# Patient Record
Sex: Male | Born: 1960 | Race: Black or African American | Hispanic: No | Marital: Married | Smoking: Never smoker
Health system: Southern US, Community
[De-identification: ages and names within clinical notes are randomized; demographics above are authoritative.]

## PROBLEM LIST (undated history)

## (undated) DIAGNOSIS — Z86718 Personal history of other venous thrombosis and embolism: Secondary | ICD-10-CM

## (undated) DIAGNOSIS — M1990 Unspecified osteoarthritis, unspecified site: Secondary | ICD-10-CM

## (undated) DIAGNOSIS — I10 Essential (primary) hypertension: Secondary | ICD-10-CM

## (undated) HISTORY — PX: KNEE SURGERY: SHX244

---

## 2013-12-11 DIAGNOSIS — M25561 Pain in right knee: Secondary | ICD-10-CM | POA: Insufficient documentation

## 2013-12-11 DIAGNOSIS — M25552 Pain in left hip: Secondary | ICD-10-CM | POA: Insufficient documentation

## 2013-12-11 DIAGNOSIS — M25562 Pain in left knee: Secondary | ICD-10-CM

## 2013-12-11 DIAGNOSIS — M25551 Pain in right hip: Secondary | ICD-10-CM | POA: Insufficient documentation

## 2013-12-11 DIAGNOSIS — I10 Essential (primary) hypertension: Secondary | ICD-10-CM | POA: Insufficient documentation

## 2015-08-14 DIAGNOSIS — L821 Other seborrheic keratosis: Secondary | ICD-10-CM | POA: Insufficient documentation

## 2015-11-06 DIAGNOSIS — N3001 Acute cystitis with hematuria: Secondary | ICD-10-CM | POA: Insufficient documentation

## 2016-02-17 DIAGNOSIS — M1710 Unilateral primary osteoarthritis, unspecified knee: Secondary | ICD-10-CM | POA: Insufficient documentation

## 2017-11-03 DIAGNOSIS — H0014 Chalazion left upper eyelid: Secondary | ICD-10-CM | POA: Insufficient documentation

## 2018-02-24 DIAGNOSIS — M179 Osteoarthritis of knee, unspecified: Secondary | ICD-10-CM | POA: Insufficient documentation

## 2018-02-24 DIAGNOSIS — M1710 Unilateral primary osteoarthritis, unspecified knee: Secondary | ICD-10-CM | POA: Insufficient documentation

## 2018-02-26 ENCOUNTER — Other Ambulatory Visit: Payer: Self-pay

## 2018-02-26 ENCOUNTER — Emergency Department: Payer: 59

## 2018-02-26 ENCOUNTER — Encounter: Payer: Self-pay | Admitting: Emergency Medicine

## 2018-02-26 ENCOUNTER — Inpatient Hospital Stay
Admission: EM | Admit: 2018-02-26 | Discharge: 2018-03-02 | DRG: 270 | Disposition: A | Discharge status: Home / Self Care | Payer: 59 | Attending: Internal Medicine | Admitting: Internal Medicine

## 2018-02-26 DIAGNOSIS — I82401 Acute embolism and thrombosis of unspecified deep veins of right lower extremity: Secondary | ICD-10-CM | POA: Diagnosis not present

## 2018-02-26 DIAGNOSIS — I82402 Acute embolism and thrombosis of unspecified deep veins of left lower extremity: Secondary | ICD-10-CM | POA: Diagnosis not present

## 2018-02-26 DIAGNOSIS — I82492 Acute embolism and thrombosis of other specified deep vein of left lower extremity: Secondary | ICD-10-CM

## 2018-02-26 DIAGNOSIS — I10 Essential (primary) hypertension: Secondary | ICD-10-CM | POA: Diagnosis present

## 2018-02-26 DIAGNOSIS — Z6841 Body Mass Index (BMI) 40.0 and over, adult: Secondary | ICD-10-CM | POA: Diagnosis not present

## 2018-02-26 DIAGNOSIS — Z91041 Radiographic dye allergy status: Secondary | ICD-10-CM | POA: Diagnosis not present

## 2018-02-26 DIAGNOSIS — M1990 Unspecified osteoarthritis, unspecified site: Secondary | ICD-10-CM | POA: Diagnosis present

## 2018-02-26 DIAGNOSIS — I824Y2 Acute embolism and thrombosis of unspecified deep veins of left proximal lower extremity: Secondary | ICD-10-CM | POA: Diagnosis not present

## 2018-02-26 DIAGNOSIS — I82432 Acute embolism and thrombosis of left popliteal vein: Secondary | ICD-10-CM | POA: Diagnosis present

## 2018-02-26 DIAGNOSIS — R0902 Hypoxemia: Secondary | ICD-10-CM | POA: Diagnosis present

## 2018-02-26 DIAGNOSIS — I5030 Unspecified diastolic (congestive) heart failure: Secondary | ICD-10-CM | POA: Diagnosis not present

## 2018-02-26 DIAGNOSIS — E119 Type 2 diabetes mellitus without complications: Secondary | ICD-10-CM | POA: Diagnosis present

## 2018-02-26 DIAGNOSIS — I82462 Acute embolism and thrombosis of left calf muscular vein: Secondary | ICD-10-CM | POA: Diagnosis present

## 2018-02-26 DIAGNOSIS — R0602 Shortness of breath: Secondary | ICD-10-CM | POA: Diagnosis not present

## 2018-02-26 DIAGNOSIS — I82412 Acute embolism and thrombosis of left femoral vein: Secondary | ICD-10-CM | POA: Diagnosis present

## 2018-02-26 DIAGNOSIS — I2699 Other pulmonary embolism without acute cor pulmonale: Secondary | ICD-10-CM | POA: Diagnosis present

## 2018-02-26 DIAGNOSIS — Z79899 Other long term (current) drug therapy: Secondary | ICD-10-CM

## 2018-02-26 DIAGNOSIS — I82409 Acute embolism and thrombosis of unspecified deep veins of unspecified lower extremity: Secondary | ICD-10-CM

## 2018-02-26 HISTORY — DX: Essential (primary) hypertension: I10

## 2018-02-26 HISTORY — DX: Unspecified osteoarthritis, unspecified site: M19.90

## 2018-02-26 LAB — LIPID PANEL
Cholesterol: 90 mg/dL (ref 0–200)
HDL: 40 mg/dL — ABNORMAL LOW (ref >40)
LDL Cholesterol: 10 mg/dL (ref 0–99)
Total CHOL/HDL Ratio: 2.3 RATIO
Triglycerides: 202 mg/dL — ABNORMAL HIGH (ref <150)
VLDL: 40 mg/dL (ref 0–40)

## 2018-02-26 LAB — BASIC METABOLIC PANEL
Anion gap: 12 (ref 5–15)
BUN: 18 mg/dL (ref 6–20)
CO2: 22 mmol/L (ref 22–32)
Calcium: 8.7 mg/dL — ABNORMAL LOW (ref 8.9–10.3)
Chloride: 105 mmol/L (ref 98–111)
Creatinine, Ser: 1.21 mg/dL (ref 0.61–1.24)
GFR calc Af Amer: >60 mL/min (ref >60)
GFR calc non Af Amer: >60 mL/min (ref >60)
Glucose, Bld: 181 mg/dL — ABNORMAL HIGH (ref 70–99)
Potassium: 3.7 mmol/L (ref 3.5–5.1)
Sodium: 139 mmol/L (ref 135–145)

## 2018-02-26 LAB — CBC
HCT: 46.6 % (ref 39.0–52.0)
Hemoglobin: 15.9 g/dL (ref 13.0–17.0)
MCH: 29.3 pg (ref 26.0–34.0)
MCHC: 34.1 g/dL (ref 30.0–36.0)
MCV: 85.8 fL (ref 80.0–100.0)
Platelets: 216 10*3/uL (ref 150–400)
RBC: 5.43 MIL/uL (ref 4.22–5.81)
RDW: 13.8 % (ref 11.5–15.5)
WBC: 14.7 10*3/uL — ABNORMAL HIGH (ref 4.0–10.5)
nRBC: 0 % (ref 0.0–0.2)

## 2018-02-26 LAB — HEPARIN LEVEL (UNFRACTIONATED): Heparin Unfractionated: 0.39 IU/mL (ref 0.30–0.70)

## 2018-02-26 LAB — PROTIME-INR
INR: 1.14
Prothrombin Time: 14.5 seconds (ref 11.4–15.2)

## 2018-02-26 LAB — TROPONIN I: Troponin I: 0.05 ng/mL (ref <0.03)

## 2018-02-26 LAB — APTT: aPTT: <24 seconds — ABNORMAL LOW (ref 24–36)

## 2018-02-26 MED ORDER — HEPARIN BOLUS VIA INFUSION
7500.0000 [IU] | Freq: Once | INTRAVENOUS | Status: AC
  Administered 2018-02-26: 7500 [IU] via INTRAVENOUS
  Filled 2018-02-26: qty 7500

## 2018-02-26 MED ORDER — HEPARIN (PORCINE) IN NACL 100-0.45 UNIT/ML-% IJ SOLN
2500.0000 [IU]/h | INTRAMUSCULAR | Status: AC
  Administered 2018-02-26: 2600 [IU]/h via INTRAVENOUS
  Administered 2018-02-26: 2300 [IU]/h via INTRAVENOUS
  Administered 2018-02-27 (×2): 2500 [IU]/h via INTRAVENOUS
  Filled 2018-02-26 (×5): qty 250

## 2018-02-26 MED ORDER — TRIAMTERENE-HCTZ 37.5-25 MG PO CAPS
1.0000 | ORAL_CAPSULE | Freq: Every day | ORAL | Status: DC
  Administered 2018-02-27 – 2018-03-02 (×4): 1 via ORAL
  Filled 2018-02-26 (×4): qty 1

## 2018-02-26 MED ORDER — METOPROLOL SUCCINATE ER 100 MG PO TB24
100.0000 mg | ORAL_TABLET | Freq: Every day | ORAL | Status: DC
  Administered 2018-02-27 – 2018-03-02 (×4): 100 mg via ORAL
  Filled 2018-02-26 (×4): qty 1

## 2018-02-26 MED ORDER — DOCUSATE SODIUM 100 MG PO CAPS: 100.0000 mg | ORAL_CAPSULE | Freq: Two times a day (BID) | ORAL | Status: DC | PRN

## 2018-02-26 NOTE — Consult Note (Signed)
ANTICOAGULATION CONSULT NOTE - Initial Consult  Pharmacy Consult for heparin drip Indication: DVT  Allergies  Allergen Reactions  . Iodinated Diagnostic Agents Anaphylaxis    Patient Measurements: Height: 6\' 7"  (200.7 cm) Weight: (!) 413 lb 6.4 oz (187.5 kg) IBW/kg (Calculated) : 93.7 Heparin Dosing Weight: 139.1kg  Vital Signs: Temp: 98.7 F (37.1 C) (10/13 1658) Temp Source: Oral (10/13 1658) BP: 158/88 (10/13 1658) Pulse Rate: 78 (10/13 1658)  Labs: Recent Labs    02/26/18 0854 02/26/18 1140 02/26/18 1730  HGB 15.9  --   --   HCT 46.6  --   --   PLT 216  --   --   APTT  --  <24*  --   LABPROT  --  14.5  --   INR  --  1.14  --   HEPARINUNFRC  --   --  0.39  CREATININE 1.21  --   --   TROPONINI 0.05*  --   --     Estimated Creatinine Clearance: 126.5 mL/min (by C-G formula based on SCr of 1.21 mg/dL).   Medical History: Past Medical History:  Diagnosis Date  . Arthritis   . Hypertension     Medications:  Scheduled:  . [START ON 02/27/2018] metoprolol succinate  100 mg Oral Daily  . [START ON 02/27/2018] triamterene-hydrochlorothiazide  1 capsule Oral Daily    Assessment: Patient is a 57 year old male who recently drove from [place] to [place]. Found to have a DVT and possible PE as he presents with leg pain and SOB. Pharmacy is consulted to dose heparin drip.  Initiation: 7500 units bolus, then infusion at 2300 units/hr 10/12 1730 HL 0.33  Goal of Therapy:  Heparin level 0.3-0.7 units/ml Monitor platelets by anticoagulation protocol: Yes   Plan: heparin level is borderline low. Will increase infusion to 2600 units/hr Check anti-Xa level in 6 hours and daily while on heparin Continue to monitor H&H and platelets  Burnis Medin, PharmD Clinical Pharmacist 02/26/2018,5:58 PM

## 2018-02-26 NOTE — H&P (Signed)
[facility] - [place] at [facility]   PATIENT NAME: Hunter Hobbs    MR#:  [id number]  DATE OF BIRTH:  [birth year]  DATE OF ADMISSION:  02/26/2018  PRIMARY CARE PHYSICIAN: System, Pcp Not In   REQUESTING/REFERRING PHYSICIAN: Dr.Paduchowski  CHIEF COMPLAINT:   Chief Complaint  Patient presents with  . Shortness of Breath    HISTORY OF PRESENT ILLNESS: Hunter Hobbs  is a 57 y.o. male with a known history of arthritis and hypertension-duo from [place] (16 hours) to [facility] on September 30, without taking any breaks except for feeling of his gas tank.  For last 1 day he has complained of some chest pain and shortness of breath with minimal exertion so came to emergency room. He has severe allergic reaction to iodine dye so ER physician could not do a CT scan on chest.  They did Doppler study on the lower extremity which confirms DVT. Started on heparin IV drip and given to hospitalist team for further management. Patient denies any history of blood clots in the past or in any family members.  PAST MEDICAL HISTORY:   Past Medical History:  Diagnosis Date  . Arthritis   . Hypertension     PAST SURGICAL HISTORY:  Past Surgical History:  Procedure Laterality Date  . KNEE SURGERY      SOCIAL HISTORY:  Social History   Tobacco Use  . Smoking status: Never Smoker  . Smokeless tobacco: Never Used  Substance Use Topics  . Alcohol use: Not Currently    Frequency: Never    FAMILY HISTORY: History reviewed. No pertinent family history.  DRUG ALLERGIES:  Allergies  Allergen Reactions  . Iodinated Diagnostic Agents Anaphylaxis    REVIEW OF SYSTEMS:   CONSTITUTIONAL: No fever, fatigue or weakness.  EYES: No blurred or double vision.  EARS, NOSE, AND THROAT: No tinnitus or ear pain.  RESPIRATORY: No cough, shortness of breath, wheezing or hemoptysis.  CARDIOVASCULAR: Patient have chest pain, no orthopnea, edema.  GASTROINTESTINAL: No nausea, vomiting,  diarrhea or abdominal pain.  GENITOURINARY: No dysuria, hematuria.  ENDOCRINE: No polyuria, nocturia,  HEMATOLOGY: No anemia, easy bruising or bleeding SKIN: No rash or lesion. MUSCULOSKELETAL: No joint pain or arthritis.   NEUROLOGIC: No tingling, numbness, weakness.  PSYCHIATRY: No anxiety or depression.   MEDICATIONS AT HOME:  Prior to Admission medications   Medication Sig Start Date End Date Taking? Authorizing Provider  metoprolol succinate (TOPROL-XL) 100 MG 24 hr tablet Take 100 mg by mouth daily. Take with or immediately following a meal.   Yes [provider]  triamterene-hydrochlorothiazide (DYAZIDE) 37.5-25 MG capsule Take 1 capsule by mouth daily. 06/07/16  Yes [provider]      PHYSICAL EXAMINATION:   VITAL SIGNS: Blood pressure (!) 135/109, pulse 71, temperature 97.9 F (36.6 C), temperature source Oral, resp. rate 20, height 6\' 7"  (2.007 m), weight (!) 190.5 kg, SpO2 (!) 88 %.  GENERAL:  57 y.o.-year-old patient lying in the bed with no acute distress.  EYES: Pupils equal, round, reactive to light and accommodation. No scleral icterus. Extraocular muscles intact.  HEENT: Head atraumatic, normocephalic. Oropharynx and nasopharynx clear.  NECK:  Supple, no jugular venous distention. No thyroid enlargement, no tenderness.  LUNGS: Normal breath sounds bilaterally, no wheezing, rales,rhonchi or crepitation. No use of accessory muscles of respiration.  CARDIOVASCULAR: S1, S2 normal. No murmurs, rubs, or gallops.  ABDOMEN: Soft, nontender, nondistended. Bowel sounds present. No organomegaly or mass.  EXTREMITIES: No pedal edema, cyanosis, or  clubbing.  NEUROLOGIC: Cranial nerves II through XII are intact. Muscle strength 5/5 in all extremities. Sensation intact. Gait not checked.  PSYCHIATRIC: The patient is alert and oriented x 3.  SKIN: No obvious rash, lesion, or ulcer.   LABORATORY PANEL:   CBC Recent Labs  Lab 02/26/18 0854  WBC 14.7*  HGB  15.9  HCT 46.6  PLT 216  MCV 85.8  MCH 29.3  MCHC 34.1  RDW 13.8   ------------------------------------------------------------------------------------------------------------------  Chemistries  Recent Labs  Lab 02/26/18 0854  NA 139  K 3.7  CL 105  CO2 22  GLUCOSE 181*  BUN 18  CREATININE 1.21  CALCIUM 8.7*   ------------------------------------------------------------------------------------------------------------------ estimated creatinine clearance is 127.7 mL/min (by C-G formula based on SCr of 1.21 mg/dL). ------------------------------------------------------------------------------------------------------------------ No results for input(s): TSH, T4TOTAL, T3FREE, THYROIDAB in the last 72 hours.  Invalid input(s): FREET3   Coagulation profile Recent Labs  Lab 02/26/18 1140  INR 1.14   ------------------------------------------------------------------------------------------------------------------- No results for input(s): DDIMER in the last 72 hours. -------------------------------------------------------------------------------------------------------------------  Cardiac Enzymes Recent Labs  Lab 02/26/18 0854  TROPONINI 0.05*   ------------------------------------------------------------------------------------------------------------------ Invalid input(s): POCBNP  ---------------------------------------------------------------------------------------------------------------  Urinalysis No results found for: COLORURINE, APPEARANCEUR, LABSPEC, PHURINE, GLUCOSEU, HGBUR, BILIRUBINUR, KETONESUR, PROTEINUR, UROBILINOGEN, NITRITE, LEUKOCYTESUR   RADIOLOGY: Dg Chest 2 View  Result Date: 02/26/2018 CLINICAL DATA:  Shortness of breath and lower leg pain EXAM: CHEST - 2 VIEW COMPARISON:  None. FINDINGS: Cardiac shadows within normal limits. The lungs are well aerated bilaterally. No focal infiltrate or sizable effusion is seen. Degenerative changes of  the thoracic spine are noted. IMPRESSION: No active cardiopulmonary disease. Electronically Signed   By: Alcide Clever M.D.   On: 02/26/2018 09:53   US Venous Img Lower Unilateral Left  Result Date: 02/26/2018 CLINICAL DATA:  Shortness of breath and thigh pain EXAM: LEFT LOWER EXTREMITY VENOUS DOPPLER ULTRASOUND TECHNIQUE: Gray-scale sonography with graded compression, as well as color Doppler and duplex ultrasound were performed to evaluate the lower extremity deep venous systems from the level of the common femoral vein and including the common femoral, femoral, profunda femoral, popliteal and calf veins including the posterior tibial, peroneal and gastrocnemius veins when visible. The superficial great saphenous vein was also interrogated. Spectral Doppler was utilized to evaluate flow at rest and with distal augmentation maneuvers in the common femoral, femoral and popliteal veins. COMPARISON:  None. FINDINGS: Contralateral Common Femoral Vein: Respiratory phasicity is normal and symmetric with the symptomatic side. No evidence of thrombus. Normal compressibility. Common Femoral Vein: No evidence of thrombus. Normal compressibility, respiratory phasicity and response to augmentation. Saphenofemoral Junction: No evidence of thrombus. Normal compressibility and flow on color Doppler imaging. Profunda Femoral Vein: No evidence of thrombus. Normal compressibility and flow on color Doppler imaging. Femoral Vein: Occlusive thrombus is noted with noncompressibility. Popliteal Vein: Occlusive thrombus with noncompressibility. Calf Veins: Occlusive thrombus with noncompressibility. Superficial Great Saphenous Vein: No evidence of thrombus. Normal compressibility. Venous Reflux:  None. Other Findings:  None. IMPRESSION: Left deep venous thrombosis extending from the calf veins into the femoral vein. The common femoral vein is not involved. Electronically Signed   By: Alcide Clever M.D.   On: 02/26/2018 09:51     EKG: Orders placed or performed during the hospital encounter of 02/26/18  . EKG 12-Lead  . EKG 12-Lead  . ED EKG within 10 minutes  . ED EKG within 10 minutes    IMPRESSION AND PLAN:  *DVT Possible pulmonary embolism  IV heparin drip for now. Will be  able to switch to oral tablet once patient is more comfortable. Check echocardiogram. VQ scan is ordered as patient has allergy to IV contrast.  *Hypertension Continue home medications.  *Leukocytosis Could be secondary to stress of DVT.  All the records are reviewed and case discussed with ED provider. Management plans discussed with the patient, family and they are in agreement.  CODE STATUS: Full code.   TOTAL TIME TAKING CARE OF THIS PATIENT: 45 minutes.    Altamese Dilling M.D on 02/26/2018   Between 7am to 6pm - Pager - [phone]  After 6pm go to [email or web address] - password [email or web address]  [facility]  Office  [phone]  CC: Primary care physician; System, Pcp Not In   Note: This dictation was prepared with Dragon dictation along with smaller phrase technology. Any transcriptional errors that result from this process are unintentional.

## 2018-02-26 NOTE — ED Provider Notes (Signed)
[facility] Emergency Department Provider Note  Time seen: 9:52 AM  I have reviewed the triage vital signs and the nursing notes.   HISTORY  Chief Complaint Shortness of Breath    HPI Hunter Hobbs is a 57 y.o. male with a past medical history of arthritis, hypertension, presents to the emergency department for left thigh pain and shortness of breath.  According to the patient last week he drove 16 hours from [place] to [place].  States on Thursday (3 days ago) he developed shortness of breath with exertion and left thigh pain.  States his shortness of breath has continued, minimal shortness of breath at rest but moderate shortness of breath with any type of exertion.  Denies any history of DVT/PE previously.  Denies any chest pain.  Denies any cough or congestion.  Denies any fever.   Past Medical History:  Diagnosis Date  . Arthritis   . Hypertension     There are no active problems to display for this patient.   Past Surgical History:  Procedure Laterality Date  . KNEE SURGERY      Prior to Admission medications   Not on File    Allergies  Allergen Reactions  . Iodinated Diagnostic Agents Anaphylaxis    History reviewed. No pertinent family history.  Social History Social History   Tobacco Use  . Smoking status: Never Smoker  . Smokeless tobacco: Never Used  Substance Use Topics  . Alcohol use: Not Currently    Frequency: Never  . Drug use: Never    Review of Systems Constitutional: Negative for fever. Cardiovascular: Negative for chest pain. Respiratory: Positive for shortness of breath, worse with exertion Gastrointestinal: Negative for abdominal pain, vomiting Musculoskeletal left thigh pain Skin: Negative for skin complaints  Neurological: Negative for headache All other ROS negative  ____________________________________________   PHYSICAL EXAM:  VITAL SIGNS: ED Triage Vitals  Enc Vitals Group     BP 02/26/18  0844 (!) 136/93     Pulse Rate 02/26/18 0844 87     Resp 02/26/18 0844 20     Temp 02/26/18 0844 97.9 F (36.6 C)     Temp Source 02/26/18 0844 Oral     SpO2 02/26/18 0844 96 %     Weight 02/26/18 0845 (!) 420 lb (190.5 kg)     Height 02/26/18 0845 6\' 7"  (2.007 m)     Head Circumference --      Peak Flow --      Pain Score 02/26/18 0849 8     Pain Loc --      Pain Edu? --      Excl. in GC? --     Constitutional: Alert and oriented. Well appearing and in no distress. Eyes: Normal exam ENT   Head: Normocephalic and atraumatic.   Mouth/Throat: Mucous membranes are moist. Cardiovascular: Normal rate, regular rhythm. No murmur Respiratory: Normal respiratory effort without tachypnea nor retractions. Breath sounds are clear Gastrointestinal: Soft and nontender. No distention.  Musculoskeletal: No lower extremity tenderness or edema.  No thigh tenderness. Neurologic:  Normal speech and language. No gross focal neurologic deficits  Skin:  Skin is warm, dry and intact.  Psychiatric: Mood and affect are normal. Speech and behavior are normal.   ____________________________________________    EKG  EKG reviewed and interpreted by myself shows a normal sinus rhythm at 91 bpm with a narrow QRS, normal axis, normal intervals, no concerning ST changes.  Occasional PVC.  Overall pattern is consistent with  S1Q3T3  ____________________________________________    RADIOLOGY  Ultrasound positive for left lower exam DVT Chest x-ray negative  ____________________________________________   INITIAL IMPRESSION / ASSESSMENT AND PLAN / ED COURSE  Pertinent labs & imaging results that were available during my care of the patient were reviewed by me and considered in my medical decision making (see chart for details).  Patient presents to the emergency department for shortness of breath and left thigh pain.  Recent long drive from [place] to [place].  Concern would be for DVT with  possible pulmonary embolism.  Patient's labs are resulted largely within normal limits besides a mild leukocytosis of 14,000 and a troponin of 0.05.  We will obtain ultrasound of the left lower extremity as well as a chest x-ray.  Unfortunately patient has an anaphylactic reaction to IV contrast, I have ordered a perfusion scan.  If the ultrasound results positive for DVT we will start the patient on heparin infusion and admit to the hospitalist service for presumed pulmonary embolism.  If negative we will obtain the perfusion scan from the emergency department.  [place] positive for DVT we will start the patient on heparin infusion and obtain a VQ scan.  Patient will be admitted to the hospitalist service.  CRITICAL CARE Performed by: Minna Antis   Total critical care time: 30 minutes  Critical care time was exclusive of separately billable procedures and treating other patients.  Critical care was necessary to treat or prevent imminent or life-threatening deterioration.  Critical care was time spent personally by me on the following activities: development of treatment plan with patient and/or surrogate as well as nursing, discussions with consultants, evaluation of patient's response to treatment, examination of patient, obtaining history from patient or surrogate, ordering and performing treatments and interventions, ordering and review of laboratory studies, ordering and review of radiographic studies, pulse oximetry and re-evaluation of patient's condition.   ____________________________________________   FINAL CLINICAL IMPRESSION(S) / ED DIAGNOSES  DVT, left lower extremity Pulmonary embolism    Minna Antis, MD 02/26/18 1024

## 2018-02-26 NOTE — ED Notes (Signed)
Report to Jancy, RN  

## 2018-02-26 NOTE — Consult Note (Signed)
ANTICOAGULATION CONSULT NOTE - Initial Consult  Pharmacy Consult for heparin drip Indication: DVT  Allergies  Allergen Reactions  . Iodinated Diagnostic Agents Anaphylaxis    Patient Measurements: Height: 6\' 7"  (200.7 cm) Weight: (!) 420 lb (190.5 kg) IBW/kg (Calculated) : 93.7 Heparin Dosing Weight: 139.1kg  Vital Signs: Temp: 97.9 F (36.6 C) (10/13 0844) Temp Source: Oral (10/13 0844) BP: 136/93 (10/13 0844) Pulse Rate: 87 (10/13 0844)  Labs: Recent Labs    02/26/18 0854  HGB 15.9  HCT 46.6  PLT 216  CREATININE 1.21  TROPONINI 0.05*    Estimated Creatinine Clearance: 127.7 mL/min (by C-G formula based on SCr of 1.21 mg/dL).   Medical History: Past Medical History:  Diagnosis Date  . Arthritis   . Hypertension     Medications:  Scheduled:  . heparin  7,500 Units Intravenous Once    Assessment: Patient is a 57 year old male who recently drove from [place] to [place]. Found to have a DVT and possible PE as he presents with leg pain and SOB. Pharmacy is consulted to dose heparin drip  Goal of Therapy:  Heparin level 0.3-0.7 units/ml Monitor platelets by anticoagulation protocol: Yes   Plan:  Give 7500 units bolus x 1 Start heparin infusion at 2300 units/hr Check anti-Xa level in 6 hours and daily while on heparin Continue to monitor H&H and platelets  Hunter Hobbs D Dannia Snook, Pharm.D, BCPS Clinical Pharmacist 02/26/2018,10:42 AM

## 2018-02-26 NOTE — ED Triage Notes (Signed)
Started with acute [facility] Thursday. Flew in from [place] week and half ago.  SHOB worse on exertion. Unlabored sitting in triage.  Friday began with inner left thigh pain.  No CP currently.

## 2018-02-27 ENCOUNTER — Inpatient Hospital Stay: Payer: 59

## 2018-02-27 ENCOUNTER — Inpatient Hospital Stay (HOSPITAL_COMMUNITY)
Admit: 2018-02-27 | Discharge: 2018-02-27 | Disposition: A | Discharge status: Home / Self Care | Payer: 59 | Attending: Internal Medicine | Admitting: Internal Medicine

## 2018-02-27 ENCOUNTER — Encounter: Payer: Self-pay | Admitting: Radiology

## 2018-02-27 DIAGNOSIS — I5030 Unspecified diastolic (congestive) heart failure: Secondary | ICD-10-CM

## 2018-02-27 DIAGNOSIS — I2699 Other pulmonary embolism without acute cor pulmonale: Secondary | ICD-10-CM

## 2018-02-27 LAB — HEPARIN LEVEL (UNFRACTIONATED)
Heparin Unfractionated: 0.69 IU/mL (ref 0.30–0.70)
Heparin Unfractionated: 0.62 IU/mL (ref 0.30–0.70)
Heparin Unfractionated: 0.49 IU/mL (ref 0.30–0.70)

## 2018-02-27 LAB — HEMOGLOBIN A1C
Hgb A1c MFr Bld: 7.1 % — ABNORMAL HIGH (ref 4.8–5.6)
Mean Plasma Glucose: 157.07 mg/dL

## 2018-02-27 LAB — CBC
HCT: 44.9 % (ref 39.0–52.0)
Hemoglobin: 15.3 g/dL (ref 13.0–17.0)
MCH: 29.2 pg (ref 26.0–34.0)
MCHC: 34.1 g/dL (ref 30.0–36.0)
MCV: 85.7 fL (ref 80.0–100.0)
Platelets: 206 10*3/uL (ref 150–400)
RBC: 5.24 MIL/uL (ref 4.22–5.81)
RDW: 13.6 % (ref 11.5–15.5)
WBC: 15.3 10*3/uL — ABNORMAL HIGH (ref 4.0–10.5)
nRBC: 0 % (ref 0.0–0.2)

## 2018-02-27 LAB — BASIC METABOLIC PANEL
Anion gap: 10 (ref 5–15)
BUN: 18 mg/dL (ref 6–20)
CO2: 23 mmol/L (ref 22–32)
Calcium: 8.6 mg/dL — ABNORMAL LOW (ref 8.9–10.3)
Chloride: 104 mmol/L (ref 98–111)
Creatinine, Ser: 1.19 mg/dL (ref 0.61–1.24)
GFR calc Af Amer: >60 mL/min (ref >60)
GFR calc non Af Amer: >60 mL/min (ref >60)
Glucose, Bld: 158 mg/dL — ABNORMAL HIGH (ref 70–99)
Potassium: 3.7 mmol/L (ref 3.5–5.1)
Sodium: 137 mmol/L (ref 135–145)

## 2018-02-27 LAB — ECHOCARDIOGRAM COMPLETE
Height: 79 in
Weight: 6614.4 oz

## 2018-02-27 MED ORDER — TECHNETIUM TO 99M ALBUMIN AGGREGATED
4.9220 | Freq: Once | INTRAVENOUS | Status: AC | PRN
  Administered 2018-02-27: 4.922 via INTRAVENOUS

## 2018-02-27 MED ORDER — APIXABAN 5 MG PO TABS: 5.0000 mg | ORAL_TABLET | Freq: Two times a day (BID) | ORAL | Status: DC

## 2018-02-27 MED ORDER — OXYCODONE-ACETAMINOPHEN 5-325 MG PO TABS
1.0000 | ORAL_TABLET | Freq: Four times a day (QID) | ORAL | Status: DC | PRN
  Administered 2018-02-27 – 2018-03-01 (×6): 1 via ORAL
  Filled 2018-02-27 (×6): qty 1

## 2018-02-27 MED ORDER — TECHNETIUM TC 99M DIETHYLENETRIAME-PENTAACETIC ACID
41.7370 | Freq: Once | INTRAVENOUS | Status: AC | PRN
  Administered 2018-02-27: 41.737 via RESPIRATORY_TRACT

## 2018-02-27 MED ORDER — APIXABAN 5 MG PO TABS
10.0000 mg | ORAL_TABLET | Freq: Two times a day (BID) | ORAL | Status: DC
  Administered 2018-02-27 – 2018-02-28 (×3): 10 mg via ORAL
  Filled 2018-02-27 (×4): qty 2

## 2018-02-27 MED ORDER — PERFLUTREN LIPID MICROSPHERE
1.0000 mL | INTRAVENOUS | Status: AC | PRN
  Administered 2018-02-27: 2 mL via INTRAVENOUS
  Filled 2018-02-27: qty 10

## 2018-02-27 NOTE — Consult Note (Signed)
ANTICOAGULATION CONSULT NOTE - Initial Consult  Pharmacy Consult for heparin drip Indication: DVT  Allergies  Allergen Reactions  . Iodinated Diagnostic Agents Anaphylaxis    Patient Measurements: Height: 6\' 7"  (200.7 cm) Weight: (!) 413 lb 6.4 oz (187.5 kg) IBW/kg (Calculated) : 93.7 Heparin Dosing Weight: 139.1kg  Vital Signs: Temp: 98.3 F (36.8 C) (10/14 0842) Temp Source: Oral (10/14 0842) BP: 124/88 (10/14 0842) Pulse Rate: 79 (10/14 0842)  Labs: Recent Labs    02/26/18 0854 02/26/18 1140  02/27/18 0151 02/27/18 0928 02/27/18 1458  HGB 15.9  --   --  15.3  --   --   HCT 46.6  --   --  44.9  --   --   PLT 216  --   --  206  --   --   APTT  --  <24*  --   --   --   --   LABPROT  --  14.5  --   --   --   --   INR  --  1.14  --   --   --   --   HEPARINUNFRC  --   --    < > 0.69 0.62 0.49  CREATININE 1.21  --   --  1.19  --   --   TROPONINI 0.05*  --   --   --   --   --    < > = values in this interval not displayed.    Estimated Creatinine Clearance: 128.6 mL/min (by C-G formula based on SCr of 1.19 mg/dL).   Medical History: Past Medical History:  Diagnosis Date  . Arthritis   . Hypertension     Medications:  Scheduled:  . apixaban  10 mg Oral BID   Followed by  . [START ON 03/06/2018] apixaban  5 mg Oral BID  . metoprolol succinate  100 mg Oral Daily  . triamterene-hydrochlorothiazide  1 capsule Oral Daily    Assessment: Patient is a 57 year old male who recently drove from [place] to [facility]. Found to have a DVT and possible PE as he presents with leg pain and SOB. Pharmacy is consulted to dose heparin drip.  Currently on Heparin 2500 unit/hr, 1500 HL resulted at 0.49   Goal of Therapy:  Heparin level 0.3-0.7 units/ml Monitor platelets by anticoagulation protocol: Yes   Plan:  Will continue with current rate at recheck HL in 6 hours at 15:00. Plan to convert patient to apixaban this evening.  Clovia Cuff, PharmD, BCPS 02/27/2018 5:41 PM

## 2018-02-27 NOTE — Plan of Care (Signed)
Improved symptoms

## 2018-02-27 NOTE — Progress Notes (Signed)
Patient is very pleased about the thrombectomy procedure scheduled for Wednesday.  This is the most upbeat he has been since admission

## 2018-02-27 NOTE — Progress Notes (Addendum)
[facility] - [place] at [facility]   PATIENT NAME: Hunter Hobbs    MR#:  [id number]  DATE OF BIRTH:  07-30-1960  SUBJECTIVE:  patient came in after having increasing shortness of breath and left leg pain for 10 to 12 days. Reports feeling better which shortness of breath since he came to the emergency room and admitted. He is currently on heparin drip. REVIEW OF SYSTEMS:   Review of Systems  Constitutional: Negative for chills, fever and weight loss.  HENT: Negative for ear discharge, ear pain and nosebleeds.   Eyes: Negative for blurred vision, pain and discharge.  Respiratory: Positive for shortness of breath. Negative for sputum production, wheezing and stridor.   Cardiovascular: Negative for chest pain, palpitations, orthopnea and PND.  Gastrointestinal: Negative for abdominal pain, diarrhea, nausea and vomiting.  Genitourinary: Negative for frequency and urgency.  Musculoskeletal: Positive for joint pain. Negative for back pain.  Neurological: Negative for sensory change, speech change, focal weakness and weakness.  Psychiatric/Behavioral: Negative for depression and hallucinations. The patient is not nervous/anxious.    Tolerating Diet:yesTolerating PT: ambulatory  DRUG ALLERGIES:   Allergies  Allergen Reactions  . Iodinated Diagnostic Agents Anaphylaxis    VITALS:  Blood pressure 124/88, pulse 79, temperature 98.3 F (36.8 C), temperature source Oral, resp. rate 16, height 6\' 7"  (2.007 m), weight (!) 187.5 kg, SpO2 97 %.  PHYSICAL EXAMINATION:   Physical Exam  GENERAL:  57 y.o.-year-old patient lying in the bed with no acute distress.  EYES: Pupils equal, round, reactive to light and accommodation. No scleral icterus. Extraocular muscles intact.  HEENT: Head atraumatic, normocephalic. Oropharynx and nasopharynx clear.  NECK:  Supple, no jugular venous distention. No thyroid enlargement, no tenderness.  LUNGS: Normal breath sounds  bilaterally, no wheezing, rales, rhonchi. No use of accessory muscles of respiration.  CARDIOVASCULAR: S1, S2 normal. No murmurs, rubs, or gallops.  ABDOMEN: Soft, nontender, nondistended. Bowel sounds present. No organomegaly or mass.  EXTREMITIES: No cyanosis, clubbing or edema b/l.    NEUROLOGIC: Cranial nerves II through XII are intact. No focal Motor or sensory deficits b/l.   PSYCHIATRIC:  patient is alert and oriented x 3.  SKIN: No obvious rash, lesion, or ulcer.   LABORATORY PANEL:  CBC Recent Labs  Lab 02/27/18 0151  WBC 15.3*  HGB 15.3  HCT 44.9  PLT 206    Chemistries  Recent Labs  Lab 02/27/18 0151  NA 137  K 3.7  CL 104  CO2 23  GLUCOSE 158*  BUN 18  CREATININE 1.19  CALCIUM 8.6*   Cardiac Enzymes Recent Labs  Lab 02/26/18 0854  TROPONINI 0.05*   RADIOLOGY:  Dg Chest 2 View  Result Date: 02/26/2018 CLINICAL DATA:  Shortness of breath and lower leg pain EXAM: CHEST - 2 VIEW COMPARISON:  None. FINDINGS: Cardiac shadows within normal limits. The lungs are well aerated bilaterally. No focal infiltrate or sizable effusion is seen. Degenerative changes of the thoracic spine are noted. IMPRESSION: No active cardiopulmonary disease. Electronically Signed   By: Alcide Clever M.D.   On: 02/26/2018 09:53   Nm Pulmonary Vent And Perf (v/q Scan)  Result Date: 02/27/2018 CLINICAL DATA:  57 year old male with shortness of breath and a history of left leg DVT EXAM: NUCLEAR MEDICINE VENTILATION - PERFUSION LUNG SCAN TECHNIQUE: Ventilation images were obtained in multiple projections using inhaled aerosol Tc-78m DTPA. Perfusion images were obtained in multiple projections after intravenous injection of Tc-69m-MAA. RADIOPHARMACEUTICALS:  41.7 mCi of Tc-69m DTPA  aerosol inhalation and 4.9 mCi Tc50m-MAA IV COMPARISON:  Chest x-ray 02/26/2018, 02/27/2018 FINDINGS: Ventilation: No focal ventilation defect. Perfusion: Decreased perfusion of the left lung on anterior posterior and  oblique perfusion planar imaging. IMPRESSION: High probability ventilation perfusion study involving the left lung Electronically Signed   By: Gilmer Mor D.O.   On: 02/27/2018 14:40   US Venous Img Lower Unilateral Left  Result Date: 02/26/2018 CLINICAL DATA:  Shortness of breath and thigh pain EXAM: LEFT LOWER EXTREMITY VENOUS DOPPLER ULTRASOUND TECHNIQUE: Gray-scale sonography with graded compression, as well as color Doppler and duplex ultrasound were performed to evaluate the lower extremity deep venous systems from the level of the common femoral vein and including the common femoral, femoral, profunda femoral, popliteal and calf veins including the posterior tibial, peroneal and gastrocnemius veins when visible. The superficial great saphenous vein was also interrogated. Spectral Doppler was utilized to evaluate flow at rest and with distal augmentation maneuvers in the common femoral, femoral and popliteal veins. COMPARISON:  None. FINDINGS: Contralateral Common Femoral Vein: Respiratory phasicity is normal and symmetric with the symptomatic side. No evidence of thrombus. Normal compressibility. Common Femoral Vein: No evidence of thrombus. Normal compressibility, respiratory phasicity and response to augmentation. Saphenofemoral Junction: No evidence of thrombus. Normal compressibility and flow on color Doppler imaging. Profunda Femoral Vein: No evidence of thrombus. Normal compressibility and flow on color Doppler imaging. Femoral Vein: Occlusive thrombus is noted with noncompressibility. Popliteal Vein: Occlusive thrombus with noncompressibility. Calf Veins: Occlusive thrombus with noncompressibility. Superficial Great Saphenous Vein: No evidence of thrombus. Normal compressibility. Venous Reflux:  None. Other Findings:  None. IMPRESSION: Left deep venous thrombosis extending from the calf veins into the femoral vein. The common femoral vein is not involved. Electronically Signed   By: Alcide Clever  M.D.   On: 02/26/2018 09:51   Dg Chest Port 1 View  Result Date: 02/27/2018 CLINICAL DATA:  Shortness of breath EXAM: PORTABLE CHEST 1 VIEW COMPARISON:  Yesterday FINDINGS: Prominent heart size accentuated by portable technique. Stable aortic tortuosity. Low volumes with interstitial crowding. There is no edema, consolidation, effusion, or pneumothorax. IMPRESSION: Stable from yesterday.  No evidence of active disease. Electronically Signed   By: Marnee Spring M.D.   On: 02/27/2018 10:18   ASSESSMENT AND PLAN:   Evie Crumpler  is a 57 y.o. male with a known history of arthritis and hypertension-drove from [place] (16 hours) to [facility] on September 30, without taking any breaks except for feeling of his gas tank.  For last 1 day he has complained of some chest pain and shortness of breath with minimal exertion so came to emergency room.  1. left lower extremity extensive DVT/high probability of PE left lung -calf veins to femoral vein -IV heparin drip-- will transition to oral anticoagulation with eliquis -echo of the heart does not show right heart strain EF 55 to 60% -VQ scan suggestive of high probability of PE left lung -vascular surgery consultation with Dr. Wyn Quaker to see if patient would be candidate for left lower extremity thrombolysis  2. hypertension continue home meds  3. DVT prophylaxis patient on oral anticoagulation   Case discussed with Care Management/Social Worker. Management plans discussed with the patient, family and they are in agreement.  CODE STATUS: FULL  DVT Prophylaxis: eliquis  TOTAL TIME TAKING CARE OF THIS PATIENT: *30* minutes.  >50% time spent on counselling and coordination of care  POSSIBLE D/C IN *1-2* DAYS, DEPENDING ON CLINICAL CONDITION.  Note: This dictation was prepared  with Dragon dictation along with smaller phrase technology. Any transcriptional errors that result from this process are unintentional.  Enedina Finner M.D on 02/27/2018 at 2:53  PM  Between 7am to 6pm - Pager - [phone]  After 6pm go to [email or web address] - password [email or web address]  [facility]  Office  [phone]  CC: Primary care physician; System, Pcp Not InPatient ID: Hunter Hobbs, male   DOB: 02/08/1961, 57 y.o.   MRN: [id number]

## 2018-02-27 NOTE — Progress Notes (Signed)
*  PRELIMINARY RESULTS* Echocardiogram 2D Echocardiogram has been performed.  Hunter Hobbs Hunter Hobbs 02/27/2018, 10:21 AM

## 2018-02-27 NOTE — Consult Note (Signed)
ANTICOAGULATION CONSULT NOTE - Initial Consult  Pharmacy Consult for heparin drip Indication: DVT  Allergies  Allergen Reactions  . Iodinated Diagnostic Agents Anaphylaxis    Patient Measurements: Height: 6\' 7"  (200.7 cm) Weight: (!) 413 lb 6.4 oz (187.5 kg) IBW/kg (Calculated) : 93.7 Heparin Dosing Weight: 139.1kg  Vital Signs: Temp: 99.3 F (37.4 C) (10/13 2002) Temp Source: Oral (10/13 2002) BP: 150/101 (10/13 2002) Pulse Rate: 80 (10/13 2002)  Labs: Recent Labs    02/26/18 0854 02/26/18 1140 02/26/18 1730 02/27/18 0151  HGB 15.9  --   --  15.3  HCT 46.6  --   --  44.9  PLT 216  --   --  206  APTT  --  <24*  --   --   LABPROT  --  14.5  --   --   INR  --  1.14  --   --   HEPARINUNFRC  --   --  0.39 0.69  CREATININE 1.21  --   --  1.19  TROPONINI 0.05*  --   --   --     Estimated Creatinine Clearance: 128.6 mL/min (by C-G formula based on SCr of 1.19 mg/dL).   Medical History: Past Medical History:  Diagnosis Date  . Arthritis   . Hypertension     Medications:  Scheduled:  . metoprolol succinate  100 mg Oral Daily  . triamterene-hydrochlorothiazide  1 capsule Oral Daily    Assessment: Patient is a 57 year old male who recently drove from [place] to [facility]. Found to have a DVT and possible PE as he presents with leg pain and SOB. Pharmacy is consulted to dose heparin drip.  Initiation: 7500 units bolus, then infusion at 2300 units/hr 10/12 1730 HL 0.33  Goal of Therapy:  Heparin level 0.3-0.7 units/ml Monitor platelets by anticoagulation protocol: Yes   Plan:  10/14 @ 0200 HL 0.69 therapeutic, but trended up from 0.39 after rate increase. Will decrease to 2500 units/hr and will recheck HL @ 0900. CBC stable will continue to monitor.  Thomasene Ripple, PharmD, BCPS Clinical Pharmacist 02/27/2018

## 2018-02-27 NOTE — Consult Note (Addendum)
[facility] Vascular Consult Note  MRN : [id number]  Hunter Hobbs is a 57 y.o. (February 27, 1961) male who presents with chief complaint of  Chief Complaint  Patient presents with  . Shortness of Breath  .  History of Present Illness: I am asked to see the patient by Dr. Elisabeth Pigeon for extensive left lower extremity DVT.  He drove 16 hours with minimal stops last week and has been having pain in the leg since that time.  Over the past few days it has gotten worse, and he sought medical treatment.  No previous history of DVT, superficial thrombophlebitis, or other clotting issues.  No family history that he knows of.  No right leg symptoms.  No trauma or injury.  He was studied with an ultrasound which I have reviewed which demonstrates extensive left lower extremity DVT from the calf veins up to the femoral veins and I suspect it could be further although given his body habitus the iliac veins would be very difficult to identify.  Current Facility-Administered Medications  Medication Dose Route Frequency Provider Last Rate Last Dose  . apixaban (ELIQUIS) tablet 10 mg  10 mg Oral BID Foye Deer, RPH   10 mg at 02/27/18 2248   Followed by  . [START ON 03/06/2018] apixaban (ELIQUIS) tablet 5 mg  5 mg Oral BID Foye Deer, [facility]      . docusate sodium (COLACE) capsule 100 mg  100 mg Oral BID PRN Altamese Dilling, MD      . living well with diabetes book MISC   Does not apply Once Enedina Finner, MD      . metoprolol succinate (TOPROL-XL) 24 hr tablet 100 mg  100 mg Oral Daily Altamese Dilling, MD   100 mg at 02/27/18 0916  . oxyCODONE-acetaminophen (PERCOCET/ROXICET) 5-325 MG per tablet 1 tablet  1 tablet Oral Q6H PRN Shaune Pollack, MD   1 tablet at 02/28/18 0507  . triamterene-hydrochlorothiazide (DYAZIDE) 37.5-25 MG per capsule 1 capsule  1 capsule Oral Daily Altamese Dilling, MD   1 capsule at 02/27/18 4098    Past Medical History:  Diagnosis Date  .  Arthritis   . Hypertension     Past Surgical History:  Procedure Laterality Date  . KNEE SURGERY      Social History Social History   Tobacco Use  . Smoking status: Never Smoker  . Smokeless tobacco: Never Used  Substance Use Topics  . Alcohol use: Not Currently    Frequency: Never  . Drug use: Never    Family History No bleeding disorders, clotting disorders, autoimmune diseases, or aneurysms  Allergies  Allergen Reactions  . Iodinated Diagnostic Agents Anaphylaxis     REVIEW OF SYSTEMS (Negative unless checked)  Constitutional: [] Weight loss  [] Fever  [] Chills Cardiac: [] Chest pain   [] Chest pressure   [] Palpitations   [] Shortness of breath when laying flat   [] Shortness of breath at rest   [] Shortness of breath with exertion. Vascular:  [] Pain in legs with walking   [] Pain in legs at rest   [] Pain in legs when laying flat   [] Claudication   [] Pain in feet when walking  [] Pain in feet at rest  [] Pain in feet when laying flat   [x] History of DVT   [x] Phlebitis   [x] Swelling in legs   [] Varicose veins   [] Non-healing ulcers Pulmonary:   [] Uses home oxygen   [] Productive cough   [] Hemoptysis   [] Wheeze  [] COPD   [] Asthma Neurologic:  [] Dizziness  []   Blackouts   [] Seizures   [] History of stroke   [] History of TIA  [] Aphasia   [] Temporary blindness   [] Dysphagia   [] Weakness or numbness in arms   [] Weakness or numbness in legs Musculoskeletal:  [x] Arthritis   [] Joint swelling   [] Joint pain   [] Low back pain Hematologic:  [] Easy bruising  [] Easy bleeding   [] Hypercoagulable state   [] Anemic  [] Hepatitis Gastrointestinal:  [] Blood in stool   [] Vomiting blood  [] Gastroesophageal reflux/heartburn   [] Difficulty swallowing. Genitourinary:  [] Chronic kidney disease   [] Difficult urination  [] Frequent urination  [] Burning with urination   [] Blood in urine Skin:  [] Rashes   [] Ulcers   [] Wounds Psychological:  [] History of anxiety   []  History of major depression.  Physical  Examination  Vitals:   02/27/18 0842 02/27/18 1955 02/28/18 0436 02/28/18 0814  BP: 124/88 118/86 124/89 121/85  Pulse: 79 72 87 78  Resp: 16  18   Temp: 98.3 F (36.8 C) 98.3 F (36.8 C) 99 F (37.2 C) 97.9 F (36.6 C)  TempSrc: Oral Oral Oral Oral  SpO2: 97% 92% 97% 100%  Weight:      Height:       Body mass index is 46.57 kg/m. Gen:  WD/WN, NAD. Very large man Head: [place]/AT, No temporalis wasting.  Ear/Nose/Throat: Hearing grossly intact, nares w/o erythema or drainage, oropharynx w/o Erythema/Exudate Eyes: Sclera non-icteric, conjunctiva clear Neck: Trachea midline.  No JVD.  Pulmonary:  Good air movement, respirations not labored, equal bilaterally.  Cardiac: RRR, normal S1, S2. Vascular:  Vessel Right Left  Radial Palpable Palpable                                    Musculoskeletal: M/S 5/5 throughout.  Extremities without ischemic changes.  No deformity or atrophy. Left leg with 1+ edema Neurologic: Sensation grossly intact in extremities.  Symmetrical.  Speech is fluent. Motor exam as listed above. Psychiatric: Judgment intact, Mood & affect appropriate for pt's clinical situation. Dermatologic: No rashes or ulcers noted.  No cellulitis or open wounds.       CBC Lab Results  Component Value Date   WBC 13.9 (H) 02/28/2018   HGB 15.5 02/28/2018   HCT 46.7 02/28/2018   MCV 86.5 02/28/2018   PLT 218 02/28/2018    BMET    Component Value Date/Time   NA 137 02/27/2018 0151   K 3.7 02/27/2018 0151   CL 104 02/27/2018 0151   CO2 23 02/27/2018 0151   GLUCOSE 158 (H) 02/27/2018 0151   BUN 18 02/27/2018 0151   CREATININE 1.19 02/27/2018 0151   CALCIUM 8.6 (L) 02/27/2018 0151   GFRNONAA >60 02/27/2018 0151   GFRAA >60 02/27/2018 0151   Estimated Creatinine Clearance: 128.6 mL/min (by C-G formula based on SCr of 1.19 mg/dL).  COAG Lab Results  Component Value Date   INR 1.14 02/26/2018    Radiology Dg Chest 2 View  Result Date:  02/26/2018 CLINICAL DATA:  Shortness of breath and lower leg pain EXAM: CHEST - 2 VIEW COMPARISON:  None. FINDINGS: Cardiac shadows within normal limits. The lungs are well aerated bilaterally. No focal infiltrate or sizable effusion is seen. Degenerative changes of the thoracic spine are noted. IMPRESSION: No active cardiopulmonary disease. Electronically Signed   By: Alcide Clever M.D.   On: 02/26/2018 09:53   Nm Pulmonary Vent And Perf (v/q Scan)  Result Date: 02/27/2018 CLINICAL DATA:  57 year old male with shortness of breath and a history of left leg DVT EXAM: NUCLEAR MEDICINE VENTILATION - PERFUSION LUNG SCAN TECHNIQUE: Ventilation images were obtained in multiple projections using inhaled aerosol Tc-95m DTPA. Perfusion images were obtained in multiple projections after intravenous injection of Tc-42m-MAA. RADIOPHARMACEUTICALS:  41.7 mCi of Tc-70m DTPA aerosol inhalation and 4.9 mCi Tc43m-MAA IV COMPARISON:  Chest x-ray 02/26/2018, 02/27/2018 FINDINGS: Ventilation: No focal ventilation defect. Perfusion: Decreased perfusion of the left lung on anterior posterior and oblique perfusion planar imaging. IMPRESSION: High probability ventilation perfusion study involving the left lung Electronically Signed   By: Gilmer Mor D.O.   On: 02/27/2018 14:40   US Venous Img Lower Unilateral Left  Result Date: 02/26/2018 CLINICAL DATA:  Shortness of breath and thigh pain EXAM: LEFT LOWER EXTREMITY VENOUS DOPPLER ULTRASOUND TECHNIQUE: Gray-scale sonography with graded compression, as well as color Doppler and duplex ultrasound were performed to evaluate the lower extremity deep venous systems from the level of the common femoral vein and including the common femoral, femoral, profunda femoral, popliteal and calf veins including the posterior tibial, peroneal and gastrocnemius veins when visible. The superficial great saphenous vein was also interrogated. Spectral Doppler was utilized to evaluate flow at rest and  with distal augmentation maneuvers in the common femoral, femoral and popliteal veins. COMPARISON:  None. FINDINGS: Contralateral Common Femoral Vein: Respiratory phasicity is normal and symmetric with the symptomatic side. No evidence of thrombus. Normal compressibility. Common Femoral Vein: No evidence of thrombus. Normal compressibility, respiratory phasicity and response to augmentation. Saphenofemoral Junction: No evidence of thrombus. Normal compressibility and flow on color Doppler imaging. Profunda Femoral Vein: No evidence of thrombus. Normal compressibility and flow on color Doppler imaging. Femoral Vein: Occlusive thrombus is noted with noncompressibility. Popliteal Vein: Occlusive thrombus with noncompressibility. Calf Veins: Occlusive thrombus with noncompressibility. Superficial Great Saphenous Vein: No evidence of thrombus. Normal compressibility. Venous Reflux:  None. Other Findings:  None. IMPRESSION: Left deep venous thrombosis extending from the calf veins into the femoral vein. The common femoral vein is not involved. Electronically Signed   By: Alcide Clever M.D.   On: 02/26/2018 09:51   Dg Chest Port 1 View  Result Date: 02/27/2018 CLINICAL DATA:  Shortness of breath EXAM: PORTABLE CHEST 1 VIEW COMPARISON:  Yesterday FINDINGS: Prominent heart size accentuated by portable technique. Stable aortic tortuosity. Low volumes with interstitial crowding. There is no edema, consolidation, effusion, or pneumothorax. IMPRESSION: Stable from yesterday.  No evidence of active disease. Electronically Signed   By: Marnee Spring M.D.   On: 02/27/2018 10:18      Assessment/Plan 1. Extensive LLE DVT. Discussed options for treatment.  Should remain on anticoagulation. With extensive DVT in a reasonably healthy patient I think thrombolysis and thrombectomy would be a good option.  I have discussed the risks and benefits of that procedure and the patient is agreeable to proceed.  This will be scheduled  at the earliest on Wednesday and can be done as an outpatient. 2.  Hypertension.  Stable on outpatient medications and blood pressure control important in reducing the progression of atherosclerotic disease. On appropriate oral medications.    Festus Barren, MD  02/28/2018 8:48 AM    This note was created with Dragon medical transcription system.  Any error is purely unintentional

## 2018-02-27 NOTE — Consult Note (Signed)
ANTICOAGULATION CONSULT NOTE - Initial Consult  Pharmacy Consult for heparin drip Indication: DVT  Allergies  Allergen Reactions  . Iodinated Diagnostic Agents Anaphylaxis    Patient Measurements: Height: 6\' 7"  (200.7 cm) Weight: (!) 413 lb 6.4 oz (187.5 kg) IBW/kg (Calculated) : 93.7 Heparin Dosing Weight: 139.1kg  Vital Signs: Temp: 98.3 F (36.8 C) (10/14 0842) Temp Source: Oral (10/14 0842) BP: 124/88 (10/14 0842) Pulse Rate: 79 (10/14 0842)  Labs: Recent Labs    02/26/18 0854 02/26/18 1140 02/26/18 1730 02/27/18 0151 02/27/18 0928  HGB 15.9  --   --  15.3  --   HCT 46.6  --   --  44.9  --   PLT 216  --   --  206  --   APTT  --  <24*  --   --   --   LABPROT  --  14.5  --   --   --   INR  --  1.14  --   --   --   HEPARINUNFRC  --   --  0.39 0.69 0.62  CREATININE 1.21  --   --  1.19  --   TROPONINI 0.05*  --   --   --   --     Estimated Creatinine Clearance: 128.6 mL/min (by C-G formula based on SCr of 1.19 mg/dL).   Medical History: Past Medical History:  Diagnosis Date  . Arthritis   . Hypertension     Medications:  Scheduled:  . metoprolol succinate  100 mg Oral Daily  . triamterene-hydrochlorothiazide  1 capsule Oral Daily    Assessment: Patient is a 57 year old male who recently drove from [place] to [facility]. Found to have a DVT and possible PE as he presents with leg pain and SOB. Pharmacy is consulted to dose heparin drip.  Currently on Heparin 2500 unit/hr, 0900 HL resulted at 0.62   Goal of Therapy:  Heparin level 0.3-0.7 units/ml Monitor platelets by anticoagulation protocol: Yes   Plan:  Will continue with current rate at recheck HL in 6 hours at 15:00. Plan to convert patient to apixaban this evening if further procedures are not planned.  Clovia Cuff, PharmD, BCPS 02/27/2018 10:18 AM

## 2018-02-28 DIAGNOSIS — I82492 Acute embolism and thrombosis of other specified deep vein of left lower extremity: Secondary | ICD-10-CM

## 2018-02-28 DIAGNOSIS — I2699 Other pulmonary embolism without acute cor pulmonale: Secondary | ICD-10-CM

## 2018-02-28 DIAGNOSIS — R0602 Shortness of breath: Secondary | ICD-10-CM

## 2018-02-28 DIAGNOSIS — I82402 Acute embolism and thrombosis of unspecified deep veins of left lower extremity: Secondary | ICD-10-CM

## 2018-02-28 LAB — CBC
HCT: 46.7 % (ref 39.0–52.0)
Hemoglobin: 15.5 g/dL (ref 13.0–17.0)
MCH: 28.7 pg (ref 26.0–34.0)
MCHC: 33.2 g/dL (ref 30.0–36.0)
MCV: 86.5 fL (ref 80.0–100.0)
Platelets: 218 10*3/uL (ref 150–400)
RBC: 5.4 MIL/uL (ref 4.22–5.81)
RDW: 13.3 % (ref 11.5–15.5)
WBC: 13.9 10*3/uL — ABNORMAL HIGH (ref 4.0–10.5)
nRBC: 0 % (ref 0.0–0.2)

## 2018-02-28 LAB — GLUCOSE, CAPILLARY
Glucose-Capillary: 125 mg/dL — ABNORMAL HIGH (ref 70–99)
Glucose-Capillary: 102 mg/dL — ABNORMAL HIGH (ref 70–99)
Glucose-Capillary: 112 mg/dL — ABNORMAL HIGH (ref 70–99)
Glucose-Capillary: 163 mg/dL — ABNORMAL HIGH (ref 70–99)

## 2018-02-28 LAB — HIV ANTIBODY (ROUTINE TESTING W REFLEX): HIV Screen 4th Generation wRfx: NONREACTIVE

## 2018-02-28 MED ORDER — INSULIN ASPART 100 UNIT/ML ~~LOC~~ SOLN
0.0000 [IU] | Freq: Three times a day (TID) | SUBCUTANEOUS | Status: DC
  Administered 2018-02-28 – 2018-03-01 (×2): 1 [IU] via SUBCUTANEOUS
  Administered 2018-03-01: 7 [IU] via SUBCUTANEOUS
  Administered 2018-03-02: 2 [IU] via SUBCUTANEOUS
  Filled 2018-02-28 (×4): qty 1

## 2018-02-28 MED ORDER — LIVING WELL WITH DIABETES BOOK
Freq: Once | Status: AC
  Administered 2018-02-28: 10:00:00
  Filled 2018-02-28: qty 1

## 2018-02-28 NOTE — Progress Notes (Addendum)
Inpatient Diabetes Program Recommendations  AACE/ADA: New Consensus Statement on Inpatient Glycemic Control (2019)  Target Ranges:  Prepandial:   less than 140 mg/dL      Peak postprandial:   less than 180 mg/dL (1-2 hours)      Critically ill patients:  140 - 180 mg/dL   Results for Hunter Hobbs, Hunter Hobbs (MRN [id number]) as of 02/28/2018 08:03  Ref. Range 02/26/2018 08:54 02/27/2018 01:51  Glucose Latest Ref Range: 70 - 99 mg/dL 181 (H) 158 (H)   Results for Hunter Hobbs, Hunter Hobbs (MRN [id number]) as of 02/28/2018 08:03  Ref. Range 02/27/2018 09:28  Hemoglobin A1C Latest Ref Range: 4.8 - 5.6 % 7.1 (H)   Review of Glycemic Control  Diabetes history: No Outpatient Diabetes medications: NA Current orders for Inpatient glycemic control: None  Inpatient Diabetes Program Recommendations:  Correction (SSI): While inpatient, please consider ordering CBGs with Novolog correction scale ACHS. HgbA1C: A1C 7.1% on 02/27/18 indicating an average glucose of 157 mg/dl over the past 2-3 months. Noted in [place], prior A1C 6.4% on 08/25/17. Per ADA, if A1C 6.5% or greater then criteria met to dx with DM. MD, will patient be newly dx with DM. If so, please inform patient and bedside nursing so patient can be educated.  Addendum 02/28/18'@10'$ :56-Spoke with patient about new diabetes diagnosis.  Patient states that his mother has DM but he has never been dx with DM. Patient reports that his PCP in [place] was following his A1C and his last A1C was 6.4%. Discussed A1C results (7.1% on 02/27/18) and explained what an A1C is and informed patient that his current A1C indicates an average glucose of 157 mg/dl over the past 2-3 months. Discussed basic pathophysiology of DM Type 2, basic home care, importance of checking CBGs and maintaining good CBG control to prevent long-term and short-term complications. Reviewed glucose and A1C goals.  Reviewed signs and symptoms of hyperglycemia and hypoglycemia along with treatment for  both. Discussed impact of nutrition, exercise, stress, sickness, and medications on diabetes control. Patient reports that he received a steroid injection in his knee this past Wednesday and that he gets them about every 3 months. Anticipate steroid injections are contributing to hyperglycemia.  Reviewed Living Well with diabetes booklet and encouraged patient to read through entire book. MD in to see patient during discussion and notes that patient will not be started on any DM medications as an outpatient yet. Patient has an initial appointment to establish care with PCP in this area on November 18th. Recommend patient monitor glucose at home and provide data to new PCP at initial appointment as patient may need to be started on oral DM medication if glucose is consistently elevated. Patient states that his wife has DM and she will be able to help him learn more about DM as well.   Patient verbalized understanding of information discussed and he states that he has no further questions at this time related to diabetes.   RNs to provide ongoing basic DM education at bedside with this patient and engage patient to actively check blood glucose.  At time of discharge please provide Rx for: glucometer and testing supplies.  Thanks, Barnie Alderman, RN, MSN, CDE Diabetes Coordinator Inpatient Diabetes Program [phone] (Team Pager from 8am to 5pm)

## 2018-02-28 NOTE — Care Management (Signed)
Eliquis co-pay and 30 day free trial coupons given to patient.

## 2018-02-28 NOTE — Progress Notes (Signed)
At change of shift pt was having 10/10 leg pain. MD Imogene Burn made aware, PRN percocet ordered. No other new orders. Pt stated that the pain med worked and he was not having any pain at this time. Will continue to monitor.

## 2018-02-28 NOTE — Plan of Care (Signed)
  Problem: Health Behavior/Discharge Planning: Goal: Ability to manage health-related needs will improve Outcome: Progressing Note:  Patient discussed new D.M. diagnosis with the D.M. coordinator already, patient also already started on sliding scale insulin orders while here in the hospital. One unit of insulin already administered for lunchtime. Will continue to monitor / support patient. Hunter Hobbs [facility]

## 2018-02-28 NOTE — Progress Notes (Signed)
[facility] - [place] at [facility]   PATIENT NAME: Hunter Hobbs    MR#:  [id number]  DATE OF BIRTH:  03-02-1961  SUBJECTIVE:  patient came in after having increasing shortness of breath and left leg pain for 10 to 12 days. Reports feeling better which shortness of breath since he came to the emergency room and admitted. He is currently on heparin drip. REVIEW OF SYSTEMS:   Review of Systems  Constitutional: Negative for chills, fever and weight loss.  HENT: Negative for ear discharge, ear pain and nosebleeds.   Eyes: Negative for blurred vision, pain and discharge.  Respiratory: Positive for shortness of breath. Negative for sputum production, wheezing and stridor.   Cardiovascular: Negative for chest pain, palpitations, orthopnea and PND.  Gastrointestinal: Negative for abdominal pain, diarrhea, nausea and vomiting.  Genitourinary: Negative for frequency and urgency.  Musculoskeletal: Positive for joint pain. Negative for back pain.  Neurological: Negative for sensory change, speech change, focal weakness and weakness.  Psychiatric/Behavioral: Negative for depression and hallucinations. The patient is not nervous/anxious.    Tolerating Diet:yesTolerating PT: ambulatory  DRUG ALLERGIES:   Allergies  Allergen Reactions  . Iodinated Diagnostic Agents Anaphylaxis    VITALS:  Blood pressure 121/85, pulse 78, temperature 97.9 F (36.6 C), temperature source Oral, resp. rate 18, height 6\' 7"  (2.007 m), weight (!) 187.5 kg, SpO2 100 %.  PHYSICAL EXAMINATION:   Physical Exam  GENERAL:  57 y.o.-year-old patient lying in the bed with no acute distress.  EYES: Pupils equal, round, reactive to light and accommodation. No scleral icterus. Extraocular muscles intact.  HEENT: Head atraumatic, normocephalic. Oropharynx and nasopharynx clear.  NECK:  Supple, no jugular venous distention. No thyroid enlargement, no tenderness.  LUNGS: Normal breath sounds  bilaterally, no wheezing, rales, rhonchi. No use of accessory muscles of respiration.  CARDIOVASCULAR: S1, S2 normal. No murmurs, rubs, or gallops.  ABDOMEN: Soft, nontender, nondistended. Bowel sounds present. No organomegaly or mass.  EXTREMITIES: No cyanosis, clubbing or edema b/l.    NEUROLOGIC: Cranial nerves II through XII are intact. No focal Motor or sensory deficits b/l.   PSYCHIATRIC:  patient is alert and oriented x 3.  SKIN: No obvious rash, lesion, or ulcer.   LABORATORY PANEL:  CBC Recent Labs  Lab 02/28/18 0501  WBC 13.9*  HGB 15.5  HCT 46.7  PLT 218    Chemistries  Recent Labs  Lab 02/27/18 0151  NA 137  K 3.7  CL 104  CO2 23  GLUCOSE 158*  BUN 18  CREATININE 1.19  CALCIUM 8.6*   Cardiac Enzymes Recent Labs  Lab 02/26/18 0854  TROPONINI 0.05*   RADIOLOGY:  Nm Pulmonary Vent And Perf (v/q Scan)  Result Date: 02/27/2018 CLINICAL DATA:  57 year old male with shortness of breath and a history of left leg DVT EXAM: NUCLEAR MEDICINE VENTILATION - PERFUSION LUNG SCAN TECHNIQUE: Ventilation images were obtained in multiple projections using inhaled aerosol Tc-16m DTPA. Perfusion images were obtained in multiple projections after intravenous injection of Tc-38m-MAA. RADIOPHARMACEUTICALS:  41.7 mCi of Tc-31m DTPA aerosol inhalation and 4.9 mCi Tc47m-MAA IV COMPARISON:  Chest x-ray 02/26/2018, 02/27/2018 FINDINGS: Ventilation: No focal ventilation defect. Perfusion: Decreased perfusion of the left lung on anterior posterior and oblique perfusion planar imaging. IMPRESSION: High probability ventilation perfusion study involving the left lung Electronically Signed   By: Gilmer Mor D.O.   On: 02/27/2018 14:40   Dg Chest Port 1 View  Result Date: 02/27/2018 CLINICAL DATA:  Shortness of breath  EXAM: PORTABLE CHEST 1 VIEW COMPARISON:  Yesterday FINDINGS: Prominent heart size accentuated by portable technique. Stable aortic tortuosity. Low volumes with interstitial  crowding. There is no edema, consolidation, effusion, or pneumothorax. IMPRESSION: Stable from yesterday.  No evidence of active disease. Electronically Signed   By: Marnee Spring M.D.   On: 02/27/2018 10:18   ASSESSMENT AND PLAN:   Hunter Hobbs  is a 57 y.o. male with a known history of arthritis and hypertension-drove from [place] (16 hours) to [facility] on September 30, without taking any breaks except for feeling of his gas tank.  For last 1 day he has complained of some chest pain and shortness of breath with minimal exertion so came to emergency room.  1. left lower extremity extensive DVT/high probability of PE left lung per V/Q scan -US doppler LE shows Left LE DVT from calf veins to femoral vein -IV heparin drip-- will transitioned to oral anticoagulation with eliquis -echo of the heart does not show right heart strain EF 55 to 60% -VQ scan suggestive of high probability of PE left lung -vascular surgery consultation with Dr. Wyn Quaker appreciated. Scheduled for left lower extremity thrombolysis for tomorrow  2. hypertension continue home meds  3. DVT prophylaxis patient on oral anticoagulation  4. DM-2 A1c 7.1 SSI here. Patient wants to discuss with his primary care physician regarding treatment as outpatient. Diabetes education done.   Case discussed with Care Management/Social Worker. Management plans discussed with the patient, family and they are in agreement.  CODE STATUS: FULL  DVT Prophylaxis: eliquis  TOTAL TIME TAKING CARE OF THIS PATIENT: *30* minutes.  >50% time spent on counselling and coordination of care  POSSIBLE D/C IN *1-2* DAYS, DEPENDING ON CLINICAL CONDITION.  Note: This dictation was prepared with Dragon dictation along with smaller phrase technology. Any transcriptional errors that result from this process are unintentional.  Enedina Finner M.D on 02/28/2018 at 11:41 AM  Between 7am to 6pm - Pager - [phone]  After 6pm go to [email or web address] -  password [email or web address]  [facility]  Office  [phone]  CC: Primary care physician; System, Pcp Not InPatient ID: Hunter Hobbs, male   DOB: [birth year], 57 y.o.   MRN: [id number]

## 2018-03-01 ENCOUNTER — Encounter
Admission: EM | Disposition: A | Discharge status: Home / Self Care | Payer: Self-pay | Source: Home / Self Care | Attending: Internal Medicine

## 2018-03-01 ENCOUNTER — Encounter: Payer: Self-pay | Admitting: *Deleted

## 2018-03-01 ENCOUNTER — Other Ambulatory Visit (INDEPENDENT_AMBULATORY_CARE_PROVIDER_SITE_OTHER): Payer: Self-pay | Admitting: Vascular Surgery

## 2018-03-01 DIAGNOSIS — I824Y2 Acute embolism and thrombosis of unspecified deep veins of left proximal lower extremity: Secondary | ICD-10-CM

## 2018-03-01 DIAGNOSIS — I2699 Other pulmonary embolism without acute cor pulmonale: Secondary | ICD-10-CM

## 2018-03-01 DIAGNOSIS — I82401 Acute embolism and thrombosis of unspecified deep veins of right lower extremity: Secondary | ICD-10-CM

## 2018-03-01 HISTORY — PX: PERIPHERAL VASCULAR THROMBECTOMY: CATH118306

## 2018-03-01 LAB — CBC
HCT: 45.1 % (ref 39.0–52.0)
Hemoglobin: 15.1 g/dL (ref 13.0–17.0)
MCH: 28.8 pg (ref 26.0–34.0)
MCHC: 33.5 g/dL (ref 30.0–36.0)
MCV: 85.9 fL (ref 80.0–100.0)
Platelets: 238 10*3/uL (ref 150–400)
RBC: 5.25 MIL/uL (ref 4.22–5.81)
RDW: 13.5 % (ref 11.5–15.5)
WBC: 12.8 10*3/uL — ABNORMAL HIGH (ref 4.0–10.5)
nRBC: 0 % (ref 0.0–0.2)

## 2018-03-01 LAB — BASIC METABOLIC PANEL
Anion gap: 10 (ref 5–15)
BUN: 20 mg/dL (ref 6–20)
CO2: 23 mmol/L (ref 22–32)
Calcium: 9 mg/dL (ref 8.9–10.3)
Chloride: 105 mmol/L (ref 98–111)
Creatinine, Ser: 1.19 mg/dL (ref 0.61–1.24)
GFR calc Af Amer: >60 mL/min (ref >60)
GFR calc non Af Amer: >60 mL/min (ref >60)
Glucose, Bld: 141 mg/dL — ABNORMAL HIGH (ref 70–99)
Potassium: 4 mmol/L (ref 3.5–5.1)
Sodium: 138 mmol/L (ref 135–145)

## 2018-03-01 LAB — GLUCOSE, CAPILLARY
Glucose-Capillary: 137 mg/dL — ABNORMAL HIGH (ref 70–99)
Glucose-Capillary: 131 mg/dL — ABNORMAL HIGH (ref 70–99)
Glucose-Capillary: 232 mg/dL — ABNORMAL HIGH (ref 70–99)

## 2018-03-01 LAB — HEPARIN LEVEL (UNFRACTIONATED): Heparin Unfractionated: 2.2 IU/mL — ABNORMAL HIGH (ref 0.30–0.70)

## 2018-03-01 LAB — MAGNESIUM: Magnesium: 2.7 mg/dL — ABNORMAL HIGH (ref 1.7–2.4)

## 2018-03-01 SURGERY — PERIPHERAL VASCULAR THROMBECTOMY
Anesthesia: Moderate Sedation | Laterality: Left

## 2018-03-01 MED ORDER — PREDNISONE 50 MG PO TABS
50.0000 mg | ORAL_TABLET | ORAL | Status: AC
  Administered 2018-03-01: 50 mg via ORAL
  Filled 2018-03-01: qty 1

## 2018-03-01 MED ORDER — SODIUM CHLORIDE FLUSH 0.9 % IV SOLN
INTRAVENOUS | Status: AC
  Filled 2018-03-01: qty 20

## 2018-03-01 MED ORDER — HYDROMORPHONE HCL 1 MG/ML IJ SOLN: 1.0000 mg | Freq: Once | INTRAMUSCULAR | Status: DC | PRN

## 2018-03-01 MED ORDER — FAMOTIDINE 20 MG PO TABS
ORAL_TABLET | ORAL | Status: AC
  Administered 2018-03-01: 40 mg via ORAL
  Filled 2018-03-01: qty 2

## 2018-03-01 MED ORDER — FENTANYL CITRATE (PF) 100 MCG/2ML IJ SOLN
INTRAMUSCULAR | Status: DC | PRN
  Administered 2018-03-01 (×4): 50 ug via INTRAVENOUS

## 2018-03-01 MED ORDER — ALTEPLASE 2 MG IJ SOLR
INTRAMUSCULAR | Status: DC | PRN
  Administered 2018-03-01: 16 mg

## 2018-03-01 MED ORDER — MIDAZOLAM HCL 5 MG/5ML IJ SOLN
INTRAMUSCULAR | Status: AC
  Filled 2018-03-01: qty 5

## 2018-03-01 MED ORDER — CEFAZOLIN SODIUM-DEXTROSE 2-4 GM/100ML-% IV SOLN
2.0000 g | Freq: Once | INTRAVENOUS | Status: AC
  Administered 2018-03-01: 2 g via INTRAVENOUS
  Filled 2018-03-01: qty 100

## 2018-03-01 MED ORDER — METHYLPREDNISOLONE SODIUM SUCC 125 MG IJ SOLR
INTRAMUSCULAR | Status: AC
  Filled 2018-03-01: qty 2

## 2018-03-01 MED ORDER — FENTANYL CITRATE (PF) 100 MCG/2ML IJ SOLN
INTRAMUSCULAR | Status: AC
  Filled 2018-03-01: qty 2

## 2018-03-01 MED ORDER — FENTANYL CITRATE (PF) 100 MCG/2ML IJ SOLN
INTRAMUSCULAR | Status: AC
  Filled 2018-03-01: qty 4

## 2018-03-01 MED ORDER — MIDAZOLAM HCL 2 MG/2ML IJ SOLN
INTRAMUSCULAR | Status: AC
  Filled 2018-03-01: qty 2

## 2018-03-01 MED ORDER — CEFAZOLIN SODIUM-DEXTROSE 2-4 GM/100ML-% IV SOLN
2.0000 g | INTRAVENOUS | Status: DC
  Filled 2018-03-01: qty 100

## 2018-03-01 MED ORDER — HEPARIN SODIUM (PORCINE) 1000 UNIT/ML IJ SOLN
INTRAMUSCULAR | Status: AC
  Filled 2018-03-01: qty 1

## 2018-03-01 MED ORDER — METHYLPREDNISOLONE SODIUM SUCC 125 MG IJ SOLR
INTRAMUSCULAR | Status: AC
  Administered 2018-03-01: 125 mg via INTRAVENOUS
  Filled 2018-03-01: qty 2

## 2018-03-01 MED ORDER — MIDAZOLAM HCL 2 MG/2ML IJ SOLN
INTRAMUSCULAR | Status: DC | PRN
  Administered 2018-03-01 (×3): 1 mg via INTRAVENOUS
  Administered 2018-03-01: 2 mg via INTRAVENOUS

## 2018-03-01 MED ORDER — LIDOCAINE HCL (PF) 1 % IJ SOLN
INTRAMUSCULAR | Status: AC
  Filled 2018-03-01: qty 30

## 2018-03-01 MED ORDER — DIPHENHYDRAMINE HCL 50 MG/ML IJ SOLN
INTRAMUSCULAR | Status: AC
  Administered 2018-03-01: 25 mg via INTRAVENOUS
  Filled 2018-03-01: qty 1

## 2018-03-01 MED ORDER — DIPHENHYDRAMINE HCL 50 MG/ML IJ SOLN
50.0000 mg | Freq: Once | INTRAMUSCULAR | Status: AC
  Administered 2018-03-01: 25 mg via INTRAVENOUS

## 2018-03-01 MED ORDER — HEPARIN SODIUM (PORCINE) 1000 UNIT/ML IJ SOLN
INTRAMUSCULAR | Status: DC | PRN
  Administered 2018-03-01: 4000 [IU] via INTRAVENOUS

## 2018-03-01 MED ORDER — IOPAMIDOL (ISOVUE-300) INJECTION 61%
INTRAVENOUS | Status: DC | PRN
  Administered 2018-03-01: 45 mL via INTRAVENOUS

## 2018-03-01 MED ORDER — HEPARIN (PORCINE) IN NACL 1000-0.9 UT/500ML-% IV SOLN
INTRAVENOUS | Status: AC
  Filled 2018-03-01: qty 1000

## 2018-03-01 MED ORDER — ALTEPLASE 2 MG IJ SOLR
INTRAMUSCULAR | Status: AC
  Filled 2018-03-01: qty 16

## 2018-03-01 MED ORDER — SODIUM CHLORIDE 0.9 % IV SOLN
INTRAVENOUS | Status: DC
  Administered 2018-03-01: 12:00:00 via INTRAVENOUS

## 2018-03-01 MED ORDER — FAMOTIDINE 20 MG PO TABS
40.0000 mg | ORAL_TABLET | ORAL | Status: DC | PRN
  Administered 2018-03-01: 40 mg via ORAL

## 2018-03-01 MED ORDER — ONDANSETRON HCL 4 MG/2ML IJ SOLN: 4.0000 mg | Freq: Four times a day (QID) | INTRAMUSCULAR | Status: DC | PRN

## 2018-03-01 MED ORDER — HEPARIN (PORCINE) IN NACL 100-0.45 UNIT/ML-% IJ SOLN
INTRAMUSCULAR | Status: AC
  Administered 2018-03-01: 2300 [IU]/h via INTRAVENOUS
  Filled 2018-03-01: qty 250

## 2018-03-01 MED ORDER — CEFAZOLIN SODIUM-DEXTROSE 2-4 GM/100ML-% IV SOLN
INTRAVENOUS | Status: AC
  Filled 2018-03-01: qty 100

## 2018-03-01 MED ORDER — HEPARIN (PORCINE) IN NACL 100-0.45 UNIT/ML-% IJ SOLN
2300.0000 [IU]/h | INTRAMUSCULAR | Status: DC
  Administered 2018-03-01 – 2018-03-02 (×2): 2300 [IU]/h via INTRAVENOUS
  Filled 2018-03-01: qty 250

## 2018-03-01 MED ORDER — METHYLPREDNISOLONE SODIUM SUCC 125 MG IJ SOLR
125.0000 mg | Freq: Once | INTRAMUSCULAR | Status: AC
  Administered 2018-03-01: 125 mg via INTRAVENOUS

## 2018-03-01 MED ORDER — METHYLPREDNISOLONE SODIUM SUCC 125 MG IJ SOLR
125.0000 mg | INTRAMUSCULAR | Status: DC | PRN
  Administered 2018-03-01: 125 mg via INTRAVENOUS

## 2018-03-01 MED ORDER — SODIUM CHLORIDE 0.9 % IV SOLN
INTRAVENOUS | Status: DC
  Administered 2018-03-01: 1000 mL via INTRAVENOUS
  Administered 2018-03-01: via INTRAVENOUS

## 2018-03-01 SURGICAL SUPPLY — 22 items
BALLN DORADO 10X80X80 (BALLOONS) ×3
BALLN DORADO 8X100X80 (BALLOONS) ×3
BALLOON DORADO 10X80X80 (BALLOONS) ×1 IMPLANT
BALLOON DORADO 8X100X80 (BALLOONS) ×1 IMPLANT
CANISTER PENUMBRA ENGINE (MISCELLANEOUS) ×3 IMPLANT
CATH BEACON 5 .035 65 KMP TIP (CATHETERS) ×3 IMPLANT
CATH BEACON 5 .038 100 VERT TP (CATHETERS) ×3 IMPLANT
CATH INDIGO 8 XTORQ TIP 115CM (CATHETERS) ×3 IMPLANT
CATH INDIGO SEP 8 (CATHETERS) ×3 IMPLANT
CATH INFUS 90CMX50CM (CATHETERS) ×2
CATH INFUS UNIFUSE 90X50 5FR (CATHETERS) ×1 IMPLANT
DEVICE PRESTO INFLATION (MISCELLANEOUS) ×3 IMPLANT
GLIDEWIRE ADV .035X260CM (WIRE) ×3 IMPLANT
NEEDLE ENTRY 21GA 7CM ECHOTIP (NEEDLE) ×3 IMPLANT
PACK ANGIOGRAPHY (CUSTOM PROCEDURE TRAY) ×3 IMPLANT
SET INTRO CAPELLA COAXIAL (SET/KITS/TRAYS/PACK) ×3 IMPLANT
SHEATH BRITE TIP 5FRX11 (SHEATH) ×3 IMPLANT
SHEATH BRITE TIP 8FRX11 (SHEATH) ×3 IMPLANT
SYR MEDRAD MARK V 150ML (SYRINGE) ×3 IMPLANT
TUBING ASPIRATION INDIGO (MISCELLANEOUS) ×3 IMPLANT
TUBING CONTRAST HIGH PRESS 72 (TUBING) ×3 IMPLANT
WIRE J 3MM .035X145CM (WIRE) ×3 IMPLANT

## 2018-03-01 NOTE — Progress Notes (Signed)
[facility] - [place] at [facility]   PATIENT NAME: Hunter Hobbs    MR#:  [id number]  DATE OF BIRTH:  11-12-1960  SUBJECTIVE:  Feels a lot better REVIEW OF SYSTEMS:   Review of Systems  Constitutional: Negative for chills, fever and weight loss.  HENT: Negative for ear discharge, ear pain and nosebleeds.   Eyes: Negative for blurred vision, pain and discharge.  Respiratory: Positive for shortness of breath. Negative for sputum production, wheezing and stridor.   Cardiovascular: Negative for chest pain, palpitations, orthopnea and PND.  Gastrointestinal: Negative for abdominal pain, diarrhea, nausea and vomiting.  Genitourinary: Negative for frequency and urgency.  Musculoskeletal: Positive for joint pain. Negative for back pain.  Neurological: Negative for sensory change, speech change, focal weakness and weakness.  Psychiatric/Behavioral: Negative for depression and hallucinations. The patient is not nervous/anxious.    Tolerating Diet:yesTolerating PT: ambulatory  DRUG ALLERGIES:   Allergies  Allergen Reactions  . Iodinated Diagnostic Agents Anaphylaxis    VITALS:  Blood pressure 101/64, pulse 78, temperature 98.1 F (36.7 C), temperature source Oral, resp. rate 18, height 6\' 7"  (2.007 m), weight (!) 182.8 kg, SpO2 97 %.  PHYSICAL EXAMINATION:   Physical Exam  GENERAL:  57 y.o.-year-old patient lying in the bed with no acute distress.  EYES: Pupils equal, round, reactive to light and accommodation. No scleral icterus. Extraocular muscles intact.  HEENT: Head atraumatic, normocephalic. Oropharynx and nasopharynx clear.  NECK:  Supple, no jugular venous distention. No thyroid enlargement, no tenderness.  LUNGS: Normal breath sounds bilaterally, no wheezing, rales, rhonchi. No use of accessory muscles of respiration.  CARDIOVASCULAR: S1, S2 normal. No murmurs, rubs, or gallops.  ABDOMEN: Soft, nontender, nondistended. Bowel sounds present. No  organomegaly or mass.  EXTREMITIES: No cyanosis, clubbing or edema b/l.    NEUROLOGIC: Cranial nerves II through XII are intact. No focal Motor or sensory deficits b/l.   PSYCHIATRIC:  patient is alert and oriented x 3.  SKIN: No obvious rash, lesion, or ulcer.   LABORATORY PANEL:  CBC Recent Labs  Lab 03/01/18 0910  WBC 12.8*  HGB 15.1  HCT 45.1  PLT 238    Chemistries  Recent Labs  Lab 03/01/18 0910  NA 138  K 4.0  CL 105  CO2 23  GLUCOSE 141*  BUN 20  CREATININE 1.19  CALCIUM 9.0  MG 2.7*   Cardiac Enzymes Recent Labs  Lab 02/26/18 0854  TROPONINI 0.05*   RADIOLOGY:  Nm Pulmonary Vent And Perf (v/q Scan)  Result Date: 02/27/2018 CLINICAL DATA:  57 year old male with shortness of breath and a history of left leg DVT EXAM: NUCLEAR MEDICINE VENTILATION - PERFUSION LUNG SCAN TECHNIQUE: Ventilation images were obtained in multiple projections using inhaled aerosol Tc-32m DTPA. Perfusion images were obtained in multiple projections after intravenous injection of Tc-37m-MAA. RADIOPHARMACEUTICALS:  41.7 mCi of Tc-63m DTPA aerosol inhalation and 4.9 mCi Tc6m-MAA IV COMPARISON:  Chest x-ray 02/26/2018, 02/27/2018 FINDINGS: Ventilation: No focal ventilation defect. Perfusion: Decreased perfusion of the left lung on anterior posterior and oblique perfusion planar imaging. IMPRESSION: High probability ventilation perfusion study involving the left lung Electronically Signed   By: Gilmer Mor D.O.   On: 02/27/2018 14:40   ASSESSMENT AND PLAN:   Hunter Hobbs  is a 57 y.o. male with a known history of arthritis and hypertension-drove from [place] (16 hours) to [facility] on September 30, without taking any breaks except for feeling of his gas tank.  For last 1 day he has  complained of some chest pain and shortness of breath with minimal exertion so came to emergency room.  1. left lower extremity extensive DVT/high probability of PE left lung per V/Q scan -US doppler LE shows  Left LE DVT from calf veins to femoral vein -IV heparin drip-- will transitioned to oral anticoagulation with eliquis -echo of the heart does not show right heart strain EF 55 to 60% -VQ scan suggestive of high probability of PE left lung -vascular surgery consultation with Dr. Wyn Quaker appreciated. Scheduled for left lower extremity thrombolysis for today  2. hypertension continue home meds  3. DVT prophylaxis patient on oral anticoagulation  4. DM-2 A1c 7.1 SSI here. Patient wants to discuss with his primary care physician regarding treatment as outpatient. Diabetes education done.  D/c later today pending procedure   Case discussed with Care Management/Social Worker. Management plans discussed with the patient, family and they are in agreement.  CODE STATUS: FULL  DVT Prophylaxis: eliquis  TOTAL TIME TAKING CARE OF THIS PATIENT: *30* minutes.  >50% time spent on counselling and coordination of care  POSSIBLE D/C IN *1-2* DAYS, DEPENDING ON CLINICAL CONDITION.  Note: This dictation was prepared with Dragon dictation along with smaller phrase technology. Any transcriptional errors that result from this process are unintentional.  Enedina Finner M.D on 03/01/2018 at 10:55 AM  Between 7am to 6pm - Pager - [phone]  After 6pm go to [email or web address] - password [email or web address]  [facility]  Office  [phone]  CC: Primary care physician; System, Pcp Not InPatient ID: Hunter Hobbs, male   DOB: Aug 21, 1960, 57 y.o.   MRN: [id number]

## 2018-03-01 NOTE — Plan of Care (Signed)
Nutrition Education Note  RD consulted for nutrition education regarding diabetes.  Pt resting with coworkers visiting at time of visit. Pt shares that he has a family history of diabetes. Pt reports that the Diabetes Coordinator came in yesterday and provided excellent education regarding nutrition therapy for diabetes. Pt denied any questions at time time. RD provided a brief nutrition education per pt request.  Lab Results  Component Value Date   HGBA1C 7.1 (H) 02/27/2018    RD provided "Carbohydrate Counting for People with Diabetes" handout from the Academy of Nutrition and Dietetics. Discussed different food groups and their effects on blood sugar, emphasizing carbohydrate-containing foods. Provided list of carbohydrates and recommended serving sizes of common foods.  Discussed importance of controlled and consistent carbohydrate intake throughout the day. Provided examples of ways to balance meals/snacks and encouraged intake of high-fiber, whole grain complex carbohydrates. Teach back method used.  Expect good compliance.  Body mass index is 45.41 kg/m. Pt meets criteria for obesity class III based on current BMI.  Current diet order is NPO pending procedure. Labs and medications reviewed. No further nutrition interventions warranted at this time. RD contact information provided. If additional nutrition issues arise, please re-consult RD.   Earma Reading, MS, RD, LDN Inpatient Clinical Dietitian Pager: [phone] Weekend/After Hours: [phone]

## 2018-03-01 NOTE — Progress Notes (Signed)
ANTICOAGULATION CONSULT NOTE   Pharmacy Consult for Heparin Indication: DVT  Allergies  Allergen Reactions  . Iodinated Diagnostic Agents Anaphylaxis    Patient Measurements: Height: 6\' 7"  (200.7 cm) Weight: (!) 403 lb 1.6 oz (182.8 kg) IBW/kg (Calculated) : 93.7 Heparin Dosing Weight: 138.2 kg  Vital Signs: Temp: 98.1 F (36.7 C) (10/16 0804) Temp Source: Oral (10/16 0804) BP: 101/64 (10/16 0804) Pulse Rate: 78 (10/16 0804)  Labs: Recent Labs    02/27/18 0151 02/27/18 0928 02/27/18 1458 02/28/18 0501 03/01/18 0910  HGB 15.3  --   --  15.5 15.1  HCT 44.9  --   --  46.7 45.1  PLT 206  --   --  218 238  HEPARINUNFRC 0.69 0.62 0.49  --   --   CREATININE 1.19  --   --   --  1.19    Estimated Creatinine Clearance: 126.8 mL/min (by C-G formula based on SCr of 1.19 mg/dL).  Assessment: Patient is a 57yo male admitted for large DVT. Patient was initially started on Heparin drip then transitioned to apixaban. Patient went for thrombectomy this afternoon. Last dose of apixaban was on 10/15 at ~22:00. MD would like for patient to be maintained on Heparin drip overnight and then back to apixaban tomorrow. Patient did get 16mg  of TPA during thrombectomy.  Goal of Therapy:  Heparin level 0.3-0.7 units/ml Monitor platelets by anticoagulation protocol: Yes   Plan:  No bolus since patient did get some TPA during procedure. Start heparin infusion at 2300 units/hr Check anti-Xa level in 6 hours and daily while on heparin Continue to monitor H&H and platelets  Clovia Cuff, PharmD, BCPS 03/01/2018 4:10 PM

## 2018-03-01 NOTE — Op Note (Signed)
VEIN AND VASCULAR SURGERY                                                                               OPERATIVE NOTE    PRE-OPERATIVE DIAGNOSIS: Symptomatic left leg DVT; right leg DVT; bilateral pulmonary embolism; ovarian cancer  POST-OPERATIVE DIAGNOSIS: Same  PROCEDURE: 1. Ultrasound guidance for vascular access to the left popliteal vein 2. Catheter placement into the inferior vena cava 3. Inferior venacavogram 4. Introduction catheter into venous system second order catheter placement left popliteal approach 5. Percutaneous transluminal angioplasty of the superficial femoral vein to 10 mm 6. Infusion thrombolysis with 16 mg of TPA 7. Mechanical thrombectomy of the left popliteal, SFV and common femoral vein using the penumbra cat 6 catheter  SURGEON: Hortencia Pilar  ASSISTANT(S): None  ANESTHESIA: Conscious sedation was administered by the interventional radiology RN under my direct supervision. IV Versed plus fentanyl were utilized. Continuous ECG, pulse oximetry and blood pressure was monitored throughout the entire procedure.  Conscious sedation was administered for a total of 142 minutes.  ESTIMATED BLOOD LOSS: minimal  Contrast:45 cc  Fluoroscopy time:9.9 minutes  FINDING(S): 1. Patent IVC; thrombus within the left popliteal, superficial femoral vein and common femoral vein  SPECIMEN(S): none  INDICATIONS:  Hunter Hobbs is a 57 y.o. year old male who presents with massive swelling of the left leg quite painful in association with pleuritic chest pains and hypoxia as well as shortness of breath. Inferior vena cava filter is indicated for this reason. Risks and benefits including filter thrombosis, migration, fracture, bleeding, and infection were all discussed.   DESCRIPTION: After obtaining full informed written consent, the patient was brought back to the  vascular suite. The skin was sterilely prepped and draped in a sterile surgical field was created.  The patient was then positioned to the prone and the popliteal fossa of the left leg was prepped and draped in a sterile fashion. Ultrasound was placed in a sterile sleeve. Ultrasound is utilized to gain access to the popliteal vein. Vein is known to have thrombus within it by previous ultrasound. It is therefore identified by its enlarged size with heterogenous thrombus and non-compressibility. 1% lidocaine is infiltrated in soft tissues and subsequent microneedle is inserted into the popliteal vein without difficulty. Images recorded for the permanent record and the puncture is made under real-time visualization. Microwire is then advanced under fluoroscopic guidance and noted to follow the path of the superficial femoral vein. Therefore the micro-sheath was placed followed by a advantage wire and subsequently an 8 [place] sheath.  KMP catheter together with the advantage wire then advanced up to the level of the IVC and hand injection contrast is utilized to create images of the vena cava as well as left leg venogram.  A contrast injection demonstrated that the IVC, common iliac vein  and the external iliac vein on the left is patent. Catheter is then repositioned to the common femoral level and hand injection which shows the common femoral vein is also free of thrombus and patent.  Hand-injection of contrast demonstrates that there is a and occlusive thrombus within the superficial femoral vein and popliteal vein.  Penumbra cat 8 catheter is then prepped on the field and 16 mg of TPA is reconstituted 40 cc. This is then laced throughout the common femoral superficial femoral and popliteal veins. The TPA is then allowed to dwell. Subsequently, the penumbra cat 8 catheter is engaged in the aspiration mode and aspiration of the common femoral as well as the popliteal and superficial femoral veins is performed  multiple passes are made with the volumes recorded in the procedure notes.  Follow-up imaging now demonstrates that almost all the thrombus is been eliminated from the proximal popliteal as well as the superficial femoral vein. Within the superficial femoral vein there is a high-grade residual narrowing and therefore initially a 10 x 8  balloon was inflated to 16 atm for 1 minute  Follow-up imaging demonstrates there is some residual thrombus but a marked improvement and therefore the penumbra cat 8 is advanced to this level and multiple short passes are made through this region. After this maneuver follow-up imaging demonstrates near total resolution within the superficial femoral vein. Iliac veins are unchanged. Given that the patient now is widely patent with minimal residual thrombus from the distal popliteal to the IVC the procedure is terminated. The wires removed the sheath is removed pressures held and a pressure dressing with Coban and is then applied. The patient is then returned to the supine position  Interpretation: Initial images the left lower extremity then demonstrated extensive thrombus throughout the popliteal and superficial femoral veins however the common and external iliac veins remained widely patent as well as the common femoral vein being widely patent.  Thrombectomy was successful with removal of a large burden of clot and restoration of patency.  Several areas of greater than 70% stenosis are noted within the superficial femoral vein and this is treated with balloon angioplasty to 10 mm successfully with less than 50% residual stenosis following intervention described above there is near-total resolution of thrombus throughout.  COMPLICATIONS: None  CONDITION: Stable  Hortencia Pilar  03/01/2018,3:40 PM

## 2018-03-02 ENCOUNTER — Encounter: Payer: Self-pay | Admitting: Vascular Surgery

## 2018-03-02 LAB — GLUCOSE, CAPILLARY
Glucose-Capillary: 330 mg/dL — ABNORMAL HIGH (ref 70–99)
Glucose-Capillary: 174 mg/dL — ABNORMAL HIGH (ref 70–99)

## 2018-03-02 LAB — HEPARIN LEVEL (UNFRACTIONATED): Heparin Unfractionated: 2.02 IU/mL — ABNORMAL HIGH (ref 0.30–0.70)

## 2018-03-02 MED ORDER — APIXABAN 5 MG PO TABS: 5.0000 mg | ORAL_TABLET | Freq: Two times a day (BID) | ORAL | Status: DC

## 2018-03-02 MED ORDER — OXYCODONE-ACETAMINOPHEN 5-325 MG PO TABS: 1.0000 | ORAL_TABLET | Freq: Four times a day (QID) | ORAL | 0 refills | Status: DC | PRN

## 2018-03-02 MED ORDER — APIXABAN 5 MG PO TABS: ORAL_TABLET | ORAL | 3 refills | Status: DC

## 2018-03-02 MED ORDER — APIXABAN 5 MG PO TABS: ORAL_TABLET | ORAL | 4 refills | Status: DC

## 2018-03-02 MED ORDER — HEPARIN (PORCINE) IN NACL 100-0.45 UNIT/ML-% IJ SOLN: 1800.0000 [IU]/h | INTRAMUSCULAR | Status: DC

## 2018-03-02 MED ORDER — APIXABAN 5 MG PO TABS: 10.0000 mg | ORAL_TABLET | Freq: Two times a day (BID) | ORAL | Status: DC

## 2018-03-02 MED ORDER — APIXABAN 5 MG PO TABS
10.0000 mg | ORAL_TABLET | Freq: Two times a day (BID) | ORAL | Status: DC
  Administered 2018-03-02: 10 mg via ORAL
  Filled 2018-03-02: qty 2

## 2018-03-02 NOTE — Progress Notes (Signed)
ANTICOAGULATION CONSULT NOTE   Pharmacy Consult for apixaban Indication: DVT  Allergies  Allergen Reactions  . Iodinated Diagnostic Agents Anaphylaxis    Patient Measurements: Height: 6\' 7"  (200.7 cm) Weight: (!) 405 lb 14.4 oz (184.1 kg) IBW/kg (Calculated) : 93.7 Heparin Dosing Weight:    Vital Signs: Temp: 98.3 F (36.8 C) (10/17 0757) Temp Source: Oral (10/17 0757) BP: 130/87 (10/17 0757) Pulse Rate: 77 (10/17 0757)  Labs: Recent Labs    02/27/18 1458 02/28/18 0501 03/01/18 0910 03/01/18 2019 03/01/18 2230  HGB  --  15.5 15.1  --   --   HCT  --  46.7 45.1  --   --   PLT  --  218 238  --   --   HEPARINUNFRC 0.49  --   --  2.20* 2.02*  CREATININE  --   --  1.19  --   --     Estimated Creatinine Clearance: 127.4 mL/min (by C-G formula based on SCr of 1.19 mg/dL).   Medical History: Past Medical History:  Diagnosis Date  . Arthritis   . Hypertension     Medications:  Scheduled:  . apixaban  10 mg Oral BID   Followed by  . [START ON 03/05/2018] apixaban  5 mg Oral BID  . insulin aspart  0-9 Units Subcutaneous TID WC  . metoprolol succinate  100 mg Oral Daily  . triamterene-hydrochlorothiazide  1 capsule Oral Daily   Infusions:  . sodium chloride 75 mL/hr at 03/02/18 0732    Assessment: Patient is a 57yo male admitted for large DVT. Patient was initially started on Heparin drip then transitioned to apixaban. Patient went for thrombectomy this afternoon. Last dose of apixaban was on 10/15 at ~22:00. MD would like for patient to be maintained on Heparin drip overnight and then back to apixaban tomorrow. Patient did get 16mg  of TPA during thrombectomy.  Goal of Therapy:  Monitor platelets by anticoagulation protocol: Yes   Plan:  Will stop Heparin drip and resume Apixaban 10 mg po BID for a total of 14 doses then 5 mg po BID. Patient received 3 doses prior to thrombectomy.   Avenell Sellers A 03/02/2018,8:48 AM

## 2018-03-02 NOTE — Discharge Instructions (Signed)
Pt advised to see his PCP at [facility]

## 2018-03-02 NOTE — Plan of Care (Signed)
  Problem: Pain Managment: Goal: General experience of comfort will improve Outcome: Progressing Note:  Did not have to treat pt for pain this shift,    Problem: Safety: Goal: Ability to remain free from injury will improve Outcome: Progressing   Problem: Cardiovascular: Goal: Vascular access site(s) Level 0-1 will be maintained Outcome: Progressing Note:  Left leg wrapped in coban, unable to see site, but no bleeding from what I can see   Problem: Education: Goal: Knowledge of General Education information will improve Description Including pain rating scale, medication(s)/side effects and non-pharmacologic comfort measures Outcome: Completed/Met

## 2018-03-02 NOTE — Discharge Summary (Signed)
[facility] [place] [facility]   PATIENT NAME: Hunter Hobbs    MR#:  [id number]  DATE OF BIRTH:  1961-05-01  DATE OF ADMISSION:  02/26/2018 ADMITTING PHYSICIAN: Altamese Dilling, MD  DATE OF DISCHARGE: 03/02/2018  PRIMARY CARE PHYSICIAN: System, Pcp Not In    ADMISSION DIAGNOSIS:  Acute deep vein thrombosis (DVT) of other specified vein of left lower extremity (HCC) [I82.492] Pulmonary embolism, unspecified chronicity, unspecified pulmonary embolism type, unspecified whether acute cor pulmonale present (HCC) [I26.99]  DISCHARGE DIAGNOSIS:  Acute Left LE DVT s/p thrombolysis Acute Left lung PE per V/Q scan DM-2 new onset HTN  SECONDARY DIAGNOSIS:   Past Medical History:  Diagnosis Date  . Arthritis   . Hypertension     HOSPITAL COURSE:   Hunter Hobbs a known history of arthritis and hypertension-drove from [place](16 hours)to [place] on September 30,[name]  1. left lower extremity extensive DVT/high probability of PE left lung per V/Q scan -US doppler LE shows Left LE DVT from calf veins to femoral vein -received IV heparin drip-- now transitioned to oral anticoagulation with eliquis -echo of the heart does not show right heart strain EF 55 to 60% -VQ scan suggestive of high probability of PE left lung -vascular surgery consultation with Dr. Wyn Quaker appreciated.  -s/p left lower extremity thrombolysis by Dr Gilda Crease -pt doing well  2. hypertension continue home meds  3. DVT prophylaxis patient on oral anticoagulation  4. DM-2 A1c 7.1 SSI here. Patient wants to discuss with his primary care physician regarding treatment as outpatient. Diabetes education done.  D/c later today  CONSULTS OBTAINED:  Treatment Team:  Annice Needy, MD  DRUG ALLERGIES:   Allergies  Allergen Reactions  . Iodinated Diagnostic Agents Anaphylaxis    DISCHARGE MEDICATIONS:   Allergies as of 03/02/2018      Reactions   Iodinated Diagnostic Agents Anaphylaxis      Medication List    TAKE these medications   apixaban 5 MG Tabs tablet Commonly known as:  ELIQUIS Take 2 tabs twice a day for 5 days and then 1 tab twice a day starting 03/07/2018   metoprolol succinate 100 MG 24 hr tablet Commonly known as:  TOPROL-XL Take 100 mg by mouth daily. Take with or immediately following a meal.   oxyCODONE-acetaminophen 5-325 MG tablet Commonly known as:  PERCOCET/ROXICET Take 1 tablet by mouth every 6 (six) hours as needed for severe pain.   triamterene-hydrochlorothiazide 37.5-25 MG capsule Commonly known as:  DYAZIDE Take 1 capsule by mouth daily.       If you experience worsening of your admission symptoms, develop shortness of breath, life threatening emergency, suicidal or homicidal thoughts you must seek medical attention immediately by calling 911 or calling your MD immediately  if symptoms less severe.  You Must read complete instructions/literature along with all the possible adverse reactions/side effects for all the Medicines you take and that have been prescribed to you. Take any new Medicines after you have completely understood and accept all the possible adverse reactions/side effects.   Please note  You were cared for by a hospitalist during your hospital stay. If you have any questions about your discharge medications or the care you received while you were in the hospital after you are discharged, you can call  the unit and asked to speak with the hospitalist on call if the hospitalist that took care of you is not available. Once you are discharged, your primary care physician will handle any further medical issues. Please note that NO REFILLS for any discharge medications will be authorized once you are  discharged, as it is imperative that you return to your primary care physician (or establish a relationship with a primary care physician if you do not have one) for your aftercare needs so that they can reassess your need for medications and monitor your lab values. Today   SUBJECTIVE   Doing well  VITAL SIGNS:  Blood pressure 130/87, pulse 77, temperature 98.3 F (36.8 C), temperature source Oral, resp. rate 16, height 6\' 7"  (2.007 m), weight (!) 184.1 kg, SpO2 96 %.  I/O:    Intake/Output Summary (Last 24 hours) at 03/02/2018 0953 Last data filed at 03/02/2018 0732 Gross per 24 hour  Intake 2421.43 ml  Output 1100 ml  Net 1321.43 ml    PHYSICAL EXAMINATION:  GENERAL:  57 y.o.-year-old patient lying in the bed with no acute distress. obese EYES: Pupils equal, round, reactive to light and accommodation. No scleral icterus. Extraocular muscles intact.  HEENT: Head atraumatic, normocephalic. Oropharynx and nasopharynx clear.  NECK:  Supple, no jugular venous distention. No thyroid enlargement, no tenderness.  LUNGS: Normal breath sounds bilaterally, no wheezing, rales,rhonchi or crepitation. No use of accessory muscles of respiration.  CARDIOVASCULAR: S1, S2 normal. No murmurs, rubs, or gallops.  ABDOMEN: Soft, non-tender, non-distended. Bowel sounds present. No organomegaly or mass.  EXTREMITIES: No pedal edema, cyanosis, or clubbing. Left LE ACE wrap + NEUROLOGIC: Cranial nerves II through XII are intact. Muscle strength 5/5 in all extremities. Sensation intact. Gait not checked.  PSYCHIATRIC: The patient is alert and oriented x 3.  SKIN: No obvious rash, lesion, or ulcer.   DATA REVIEW:   CBC  Recent Labs  Lab 03/01/18 0910  WBC 12.8*  HGB 15.1  HCT 45.1  PLT 238    Chemistries  Recent Labs  Lab 03/01/18 0910  NA 138  K 4.0  CL 105  CO2 23  GLUCOSE 141*  BUN 20  CREATININE 1.19  CALCIUM 9.0  MG 2.7*    Microbiology Results   No results found for this  or any previous visit (from the past 240 hour(s)).  RADIOLOGY:  No results found.   Management plans discussed with the patient, family and they are in agreement.  CODE STATUS:     Code Status Orders  (From admission, onward)         Start     Ordered   02/26/18 1349  Full code  Continuous     02/26/18 1348        Code Status History    This patient has a current code status but no historical code status.      TOTAL TIME TAKING CARE OF THIS PATIENT: 40 minutes.    Enedina Finner M.D on 03/02/2018 at 9:53 AM  Between 7am to 6pm - Pager - [phone] After 6pm go to [email or web address] - password [email or web address]  [facility]  Office  [phone]  CC: Primary care physician; System, Pcp Not In

## 2018-03-02 NOTE — Progress Notes (Signed)
ANTICOAGULATION CONSULT NOTE   Pharmacy Consult for Heparin Indication: DVT  Allergies  Allergen Reactions  . Iodinated Diagnostic Agents Anaphylaxis    Patient Measurements: Height: 6\' 7"  (200.7 cm) Weight: (!) 405 lb 14.4 oz (184.1 kg) IBW/kg (Calculated) : 93.7 Heparin Dosing Weight: 138.2 kg  Vital Signs: Temp: 97.5 F (36.4 C) (10/17 0421) Temp Source: Oral (10/17 0421) BP: 123/80 (10/17 0421) Pulse Rate: 73 (10/17 0421)  Labs: Recent Labs    02/27/18 1458 02/28/18 0501 03/01/18 0910 03/01/18 2019 03/01/18 2230  HGB  --  15.5 15.1  --   --   HCT  --  46.7 45.1  --   --   PLT  --  218 238  --   --   HEPARINUNFRC 0.49  --   --  2.20* 2.02*  CREATININE  --   --  1.19  --   --     Estimated Creatinine Clearance: 127.4 mL/min (by C-G formula based on SCr of 1.19 mg/dL).  Assessment: Patient is a 57yo male admitted for large DVT. Patient was initially started on Heparin drip then transitioned to apixaban. Patient went for thrombectomy this afternoon. Last dose of apixaban was on 10/15 at ~22:00. MD would like for patient to be maintained on Heparin drip overnight and then back to apixaban tomorrow. Patient did get 16mg  of TPA during thrombectomy.  Goal of Therapy:  Heparin level 0.3-0.7 units/ml Monitor platelets by anticoagulation protocol: Yes   Plan:  No bolus since patient did get some TPA during procedure. Start heparin infusion at 2300 units/hr Check anti-Xa level in 6 hours and daily while on heparin Continue to monitor H&H and platelets   10/17 AM repeat heparin level 2.02. Hold drip x 1 hour and restart at 1800 units/hr. Recheck heparin level 6 hours after restart.  Fulton Reek, PharmD, BCPS  03/02/18 7:10 AM

## 2018-03-15 ENCOUNTER — Emergency Department
Admission: EM | Admit: 2018-03-15 | Discharge: 2018-03-16 | Disposition: A | Discharge status: Home / Self Care | Payer: 59 | Attending: Emergency Medicine | Admitting: Emergency Medicine

## 2018-03-15 ENCOUNTER — Emergency Department: Payer: 59

## 2018-03-15 DIAGNOSIS — I10 Essential (primary) hypertension: Secondary | ICD-10-CM | POA: Diagnosis not present

## 2018-03-15 DIAGNOSIS — Z7901 Long term (current) use of anticoagulants: Secondary | ICD-10-CM | POA: Diagnosis not present

## 2018-03-15 DIAGNOSIS — R42 Dizziness and giddiness: Secondary | ICD-10-CM | POA: Insufficient documentation

## 2018-03-15 DIAGNOSIS — Z79899 Other long term (current) drug therapy: Secondary | ICD-10-CM | POA: Diagnosis not present

## 2018-03-15 LAB — BLOOD GAS, VENOUS
Acid-Base Excess: 1.3 mmol/L (ref 0.0–2.0)
Bicarbonate: 25 mmol/L (ref 20.0–28.0)
O2 Saturation: 96.4 %
Patient temperature: 37
pCO2, Ven: 36 mmHg — ABNORMAL LOW (ref 44.0–60.0)
pH, Ven: 7.45 — ABNORMAL HIGH (ref 7.250–7.430)
pO2, Ven: 81 mmHg — ABNORMAL HIGH (ref 32.0–45.0)

## 2018-03-15 LAB — COMPREHENSIVE METABOLIC PANEL
ALT: 99 U/L — ABNORMAL HIGH (ref 0–44)
AST: 65 U/L — ABNORMAL HIGH (ref 15–41)
Albumin: 3.7 g/dL (ref 3.5–5.0)
Alkaline Phosphatase: 93 U/L (ref 38–126)
Anion gap: 9 (ref 5–15)
BUN: 15 mg/dL (ref 6–20)
CO2: 24 mmol/L (ref 22–32)
Calcium: 9 mg/dL (ref 8.9–10.3)
Chloride: 105 mmol/L (ref 98–111)
Creatinine, Ser: 1.29 mg/dL — ABNORMAL HIGH (ref 0.61–1.24)
GFR calc Af Amer: >60 mL/min (ref >60)
GFR calc non Af Amer: >60 mL/min (ref >60)
Glucose, Bld: 147 mg/dL — ABNORMAL HIGH (ref 70–99)
Potassium: 3.6 mmol/L (ref 3.5–5.1)
Sodium: 138 mmol/L (ref 135–145)
Total Bilirubin: 0.7 mg/dL (ref 0.3–1.2)
Total Protein: 7.7 g/dL (ref 6.5–8.1)

## 2018-03-15 LAB — CBC
HCT: 40.9 % (ref 39.0–52.0)
Hemoglobin: 13.6 g/dL (ref 13.0–17.0)
MCH: 29 pg (ref 26.0–34.0)
MCHC: 33.3 g/dL (ref 30.0–36.0)
MCV: 87.2 fL (ref 80.0–100.0)
Platelets: 303 10*3/uL (ref 150–400)
RBC: 4.69 MIL/uL (ref 4.22–5.81)
RDW: 13.4 % (ref 11.5–15.5)
WBC: 11.9 10*3/uL — ABNORMAL HIGH (ref 4.0–10.5)
nRBC: 0 % (ref 0.0–0.2)

## 2018-03-15 LAB — TROPONIN I: Troponin I: <0.03 ng/mL (ref <0.03)

## 2018-03-15 MED ORDER — SODIUM CHLORIDE 0.9 % IV BOLUS
1000.0000 mL | Freq: Once | INTRAVENOUS | Status: AC
  Administered 2018-03-15: 1000 mL via INTRAVENOUS

## 2018-03-15 NOTE — ED Triage Notes (Addendum)
Patient reports recent hospital stay (2 weeks) for DVT and PE. Patient was discharged on Eliquis.   Patient c/o dizziness.   Patient had to be awakened to be brought to triage room. Patient with unsteady gait.   Patient just finished course of ABX for UTI. Patient reports continued dysuria.

## 2018-03-15 NOTE — ED Provider Notes (Signed)
[facility] Emergency Department Provider Note    First MD Initiated Contact with Patient 03/15/18 2329     (approximate)  I have reviewed the triage vital signs and the nursing notes.   HISTORY  Chief Complaint Dizziness   HPI Hunter Hobbs is a 57 y.o. male for DVT and pulmonary emboli currently on Eliquis presents to the emergency department with intermittent dizziness times the past 2 days.  Patient denies any positional nature to the dizziness.  Patient states that he noticed that this occurs after taking his antihypertensives and oxycodone in the mornings.  Patient denies any chest pain or shortness of breath.  Patient denies any headache.  Patient denies any weakness numbness gait instability.  Patient denies any dizziness at present.     Past Medical History:  Diagnosis Date  . Arthritis   . Hypertension     Patient Active Problem List   Diagnosis Date Noted  . DVT (deep venous thrombosis) (HCC) 02/26/2018    Past Surgical History:  Procedure Laterality Date  . KNEE SURGERY    . PERIPHERAL VASCULAR THROMBECTOMY Left 03/01/2018   Procedure: PERIPHERAL VASCULAR THROMBECTOMY;  Surgeon: Renford Dills, MD;  Location: [facility] INVASIVE CV LAB;  Service: Cardiovascular;  Laterality: Left;    Prior to Admission medications   Medication Sig Start Date End Date Taking? Authorizing Provider  apixaban (ELIQUIS) 5 MG TABS tablet Take 2 tabs twice a day for 5 days and then 1 tab twice a day starting 03/07/2018 03/02/18   Enedina Finner, MD  metoprolol succinate (TOPROL-XL) 100 MG 24 hr tablet Take 100 mg by mouth daily. Take with or immediately following a meal.    [provider]  oxyCODONE-acetaminophen (PERCOCET/ROXICET) 5-325 MG tablet Take 1 tablet by mouth every 6 (six) hours as needed for severe pain. 03/02/18   Enedina Finner, MD  triamterene-hydrochlorothiazide (DYAZIDE) 37.5-25 MG capsule Take 1 capsule by mouth daily. 06/07/16   [provider]    Allergies Iodinated diagnostic agents  No family history on file.  Social History Social History   Tobacco Use  . Smoking status: Never Smoker  . Smokeless tobacco: Never Used  Substance Use Topics  . Alcohol use: Not Currently    Frequency: Never  . Drug use: Never    Review of Systems Constitutional: No fever/chills Eyes: No visual changes. ENT: No sore throat. Cardiovascular: Denies chest pain. Respiratory: Denies shortness of breath. Gastrointestinal: No abdominal pain.  No nausea, no vomiting.  No diarrhea.  No constipation. Genitourinary: Negative for dysuria. Musculoskeletal: Negative for neck pain.  Negative for back pain. Integumentary: Negative for rash. Neurological: Negative for headaches, focal weakness or numbness.  Positive for dizziness  ____________________________________________   PHYSICAL EXAM:  VITAL SIGNS: ED Triage Vitals  Enc Vitals Group     BP 03/15/18 2054 122/80     Pulse Rate 03/15/18 2054 97     Resp 03/15/18 2054 18     Temp 03/15/18 2054 98.5 F (36.9 C)     Temp Source 03/15/18 2054 Oral     SpO2 03/15/18 2054 97 %     Weight 03/15/18 2055 (!) 176.9 kg (390 lb)     Height 03/15/18 2055 2.007 m (6\' 7" )     Head Circumference --      Peak Flow --      Pain Score 03/15/18 2055 0     Pain Loc --      Pain Edu? --  Excl. in GC? --     Constitutional: Alert and oriented. Well appearing and in no acute distress. Eyes: Conjunctivae are normal. PERRL. EOMI. Head: Atraumatic. Mouth/Throat: Mucous membranes are moist.  Oropharynx non-erythematous. Neck: No stridor.  Cardiovascular: Normal rate, regular rhythm. Good peripheral circulation. Grossly normal heart sounds. Respiratory: Normal respiratory effort.  No retractions. Lungs CTAB. Gastrointestinal: Soft and nontender. No distention.  Musculoskeletal: No lower extremity tenderness nor edema. No gross deformities of extremities. Neurologic:  Normal speech  and language. No gross focal neurologic deficits are appreciated.  Skin:  Skin is warm, dry and intact. No rash noted. Psychiatric: Mood and affect are normal. Speech and behavior are normal.  ____________________________________________   LABS (all labs ordered are listed, but only abnormal results are displayed)  Labs Reviewed  CBC - Abnormal; Notable for the following components:      Result Value   WBC 11.9 (*)    All other components within normal limits  COMPREHENSIVE METABOLIC PANEL - Abnormal; Notable for the following components:   Glucose, Bld 147 (*)    Creatinine, Ser 1.29 (*)    AST 65 (*)    ALT 99 (*)    All other components within normal limits  BLOOD GAS, VENOUS - Abnormal; Notable for the following components:   pH, Ven 7.45 (*)    pCO2, Ven 36 (*)    pO2, Ven 81.0 (*)    All other components within normal limits  TROPONIN I  URINALYSIS, COMPLETE (UACMP) WITH MICROSCOPIC   ____________________________________________  EKG ED ECG REPORT I, [place] N Hann[place], the attending physician, personally viewed and interpreted this ECG.   Date: 03/15/2018  EKG Time: 8:44 PM  Rate: 97  Rhythm: Normal sinus rhythm  Axis: Right axis deviation  Intervals: Normal  ST&T Change: None  ____________________________________________  RADIOLOGY I, [place] N [place], personally viewed and evaluated these images (plain radiographs) as part of my medical decision making, as well as reviewing the written report by the radiologist.  ED MD interpretation: Mild vascular congestion on chest x-ray.  Official radiology report(s): Dg Chest 2 View  Result Date: 03/15/2018 CLINICAL DATA:  Recent hospital stay for DVT/PE discharged on Eliquis. Dizziness and unsteady gait. EXAM: CHEST - 2 VIEW COMPARISON:  02/27/2018 FINDINGS: Lungs are adequately inflated without focal airspace consolidation or effusion. Cardiomediastinal silhouette is within normal. There is mild prominence of the  perihilar markings unchanged. Degenerative change of the spine. IMPRESSION: Possible mild vascular congestion. Electronically Signed   By: Elberta Fortis M.D.   On: 03/15/2018 21:38      Procedures   ____________________________________________   INITIAL IMPRESSION / ASSESSMENT AND PLAN / ED COURSE  As part of my medical decision making, I reviewed the following data within the electronic MEDICAL RECORD NUMBER   57 year old male presenting with above-stated history and physical exam secondary to dizziness considered possibility of central etiology and as such CT scan of the head was performed which was unremarkable.  Patient's laboratory data notable for creatinine of 1.29 slightly elevated from the patient's previous of 1.1.  Patient given a liter of IV normal saline.  Patient remained asymptomatic while in the room.  I suspect the patient's symptoms may be secondary to oxycodone given stated relationship of dizziness following taking oxycodone within an hour of doing so. ____________________________________________  FINAL CLINICAL IMPRESSION(S) / ED DIAGNOSES  Final diagnoses:  Dizziness     MEDICATIONS GIVEN DURING THIS VISIT:  Medications - No data to display   ED Discharge  Orders    None       Note:  This document was prepared using Dragon voice recognition software and may include unintentional dictation errors.    Darci Current, MD 03/16/18 [phone]

## 2018-03-23 ENCOUNTER — Encounter (INDEPENDENT_AMBULATORY_CARE_PROVIDER_SITE_OTHER): Payer: Self-pay | Admitting: Nurse Practitioner

## 2018-03-23 ENCOUNTER — Ambulatory Visit (INDEPENDENT_AMBULATORY_CARE_PROVIDER_SITE_OTHER): Payer: 59 | Admitting: Nurse Practitioner

## 2018-03-23 VITALS — BP 141/87 | HR 76 | Resp 19 | Ht 79.0 in | Wt >= 6400 oz

## 2018-03-23 DIAGNOSIS — I10 Essential (primary) hypertension: Secondary | ICD-10-CM | POA: Diagnosis not present

## 2018-03-23 DIAGNOSIS — M1712 Unilateral primary osteoarthritis, left knee: Secondary | ICD-10-CM

## 2018-03-23 DIAGNOSIS — I2699 Other pulmonary embolism without acute cor pulmonale: Secondary | ICD-10-CM

## 2018-03-23 DIAGNOSIS — I82412 Acute embolism and thrombosis of left femoral vein: Secondary | ICD-10-CM

## 2018-03-24 ENCOUNTER — Encounter (INDEPENDENT_AMBULATORY_CARE_PROVIDER_SITE_OTHER): Payer: 59

## 2018-03-24 ENCOUNTER — Ambulatory Visit (INDEPENDENT_AMBULATORY_CARE_PROVIDER_SITE_OTHER): Payer: 59 | Admitting: Nurse Practitioner

## 2018-03-24 ENCOUNTER — Other Ambulatory Visit (INDEPENDENT_AMBULATORY_CARE_PROVIDER_SITE_OTHER): Payer: Self-pay | Admitting: Nurse Practitioner

## 2018-03-24 DIAGNOSIS — I82412 Acute embolism and thrombosis of left femoral vein: Secondary | ICD-10-CM

## 2018-03-27 ENCOUNTER — Encounter (INDEPENDENT_AMBULATORY_CARE_PROVIDER_SITE_OTHER): Payer: Self-pay | Admitting: Nurse Practitioner

## 2018-03-27 ENCOUNTER — Ambulatory Visit (INDEPENDENT_AMBULATORY_CARE_PROVIDER_SITE_OTHER): Payer: 59 | Admitting: Nurse Practitioner

## 2018-03-27 ENCOUNTER — Ambulatory Visit (INDEPENDENT_AMBULATORY_CARE_PROVIDER_SITE_OTHER): Payer: 59

## 2018-03-27 VITALS — BP 130/79 | HR 60 | Resp 17 | Ht 79.0 in | Wt >= 6400 oz

## 2018-03-27 DIAGNOSIS — I82412 Acute embolism and thrombosis of left femoral vein: Secondary | ICD-10-CM | POA: Diagnosis not present

## 2018-03-27 DIAGNOSIS — I10 Essential (primary) hypertension: Secondary | ICD-10-CM | POA: Diagnosis not present

## 2018-03-27 DIAGNOSIS — M1712 Unilateral primary osteoarthritis, left knee: Secondary | ICD-10-CM

## 2018-03-27 DIAGNOSIS — I2699 Other pulmonary embolism without acute cor pulmonale: Secondary | ICD-10-CM

## 2018-03-28 ENCOUNTER — Encounter (INDEPENDENT_AMBULATORY_CARE_PROVIDER_SITE_OTHER): Payer: Self-pay | Admitting: Nurse Practitioner

## 2018-03-28 DIAGNOSIS — I2782 Chronic pulmonary embolism: Secondary | ICD-10-CM | POA: Insufficient documentation

## 2018-03-28 DIAGNOSIS — I2699 Other pulmonary embolism without acute cor pulmonale: Secondary | ICD-10-CM | POA: Insufficient documentation

## 2018-03-28 NOTE — Progress Notes (Signed)
Subjective:    Patient ID: Hunter Hobbs, male    DOB: 1960-06-15, 57 y.o.   MRN: [id number] Chief Complaint  Patient presents with  . Follow-up    3 week [facility] f/u    HPI  Hasan Douse is a 57 y.o. male that is following up after a deep venous thrombectomy on 03/01/2018.  [facility] ultrasound shows DVT from the level of the calf to the femoral veins.  He underwent mechanical thrombectomy of the left popliteal superficial femoral vein and common femoral vein.  There was also percutaneous transluminal angioplasty of the superficial femoral vein.  The patient reports some minor swelling and pain following the procedure.  The patient was also diagnosed with a pulmonary embolus at the time.  He has been started on Eliquis which he reports no issues with bleeding during this time.  The patient states that he has been driving for extended 40+ hour previous lately.  He began to have leg pain which he misidentified his knee pain prior to diagnosis of his DVT.  He subsequently presented to the emergency room with shortness of breath when he is diagnosed.  He currently denies any shortness of breath unless he is exerting himself.  He is still anxious at the prospect that he has DVT left in his left lower extremity.  The patient denies any fever, chills, nausea, vomiting.  Patient denies any chest pain shortness of breath.  The patient denies any claudication-like symptoms. Past Medical History:  Diagnosis Date  . Arthritis   . Hypertension     Past Surgical History:  Procedure Laterality Date  . KNEE SURGERY    . PERIPHERAL VASCULAR THROMBECTOMY Left 03/01/2018   Procedure: PERIPHERAL VASCULAR THROMBECTOMY;  Surgeon: Renford Dills, MD;  Location: [facility] INVASIVE CV LAB;  Service: Cardiovascular;  Laterality: Left;    Social History   Socioeconomic History  . Marital status: Married    Spouse name: Not on file  . Number of children: Not on file  . Years of education: Not on file  . Highest  education level: Not on file  Occupational History  . Not on file  Social Needs  . Financial resource strain: Not on file  . Food insecurity:    Worry: Not on file    Inability: Not on file  . Transportation needs:    Medical: Not on file    Non-medical: Not on file  Tobacco Use  . Smoking status: Never Smoker  . Smokeless tobacco: Never Used  Substance and Sexual Activity  . Alcohol use: Not Currently    Frequency: Never  . Drug use: Never  . Sexual activity: Not on file  Lifestyle  . Physical activity:    Days per week: Not on file    Minutes per session: Not on file  . Stress: Not on file  Relationships  . Social connections:    Talks on phone: Not on file    Gets together: Not on file    Attends religious service: Not on file    Active member of club or organization: Not on file    Attends meetings of clubs or organizations: Not on file    Relationship status: Not on file  . Intimate partner violence:    Fear of current or ex partner: Not on file    Emotionally abused: Not on file    Physically abused: Not on file    Forced sexual activity: Not on file  Other Topics Concern  . Not  on file  Social History Narrative  . Not on file    History reviewed. No pertinent family history.  Allergies  Allergen Reactions  . Iodinated Diagnostic Agents Anaphylaxis  . Amlodipine     dizziness     Review of Systems   Review of Systems: Negative Unless Checked Constitutional: [] Weight loss  [] Fever  [] Chills Cardiac: [] Chest pain   []  Atrial Fibrillation  [] Palpitations   [] Shortness of breath when laying flat   [x] Shortness of breath with exertion. Vascular:  [] Pain in legs with walking   [] Pain in legs with standing  [x] History of DVT   [] Phlebitis   [x] Swelling in legs   [] Varicose veins   [] Non-healing ulcers Pulmonary:   [] Uses home oxygen   [] Productive cough   [] Hemoptysis   [] Wheeze  [] COPD   [] Asthma Neurologic:  [] Dizziness   [] Seizures   [] History of stroke    [] History of TIA  [] Aphasia   [] Vissual changes   [] Weakness or numbness in arm   [] Weakness or numbness in leg Musculoskeletal:   [] Joint swelling   [x] Joint pain   [] Low back pain  []  History of Knee Replacement Hematologic:  [] Easy bruising  [] Easy bleeding   [] Hypercoagulable state   [] Anemic Gastrointestinal:  [] Diarrhea   [] Vomiting  [] Gastroesophageal reflux/heartburn   [] Difficulty swallowing. Genitourinary:  [] Chronic kidney disease   [] Difficult urination  [] Anuric   [] Blood in urine Skin:  [] Rashes   [] Ulcers  Psychological:  [x] History of anxiety   []  History of major depression  []  Memory Difficulties     Objective:   Physical Exam  BP (!) 141/87 (BP Location: Right Arm, Patient Position: Sitting)   Pulse 76   Resp 19   Ht 6\' 7"  (2.007 m)   Wt (!) 411 lb (186.4 kg)   BMI 46.30 kg/m   Gen: WD/WN, NAD Head: [place]/AT, No temporalis wasting.  Ear/Nose/Throat: Hearing grossly intact, nares w/o erythema or drainage Eyes: PER, EOMI, sclera nonicteric.  Neck: Supple, no masses.  No JVD.  Pulmonary:  Good air movement, no use of accessory muscles.  Cardiac: RRR Vascular:  Mild swelling of left lower extremity. Vessel Right Left  Radial Palpable Palpable  Dorsalis Pedis Palpable Palpable  Posterior Tibial Palpable Palpable   Gastrointestinal: soft, non-distended. No guarding/no peritoneal signs.  Musculoskeletal: M/S 5/5 throughout.  No deformity or atrophy.  Neurologic: Pain and light touch intact in extremities.  Symmetrical.  Speech is fluent. Motor exam as listed above. Psychiatric: Judgment intact, Mood & affect appropriate for pt's clinical situation. Dermatologic: No Venous rashes. No Ulcers Noted.  No changes consistent with cellulitis. Lymph : No Cervical lymphadenopathy, no lichenification or skin changes of chronic lymphedema.      Assessment & Plan:   1. Acute deep vein thrombosis (DVT) of femoral vein of left lower extremity (HCC) Recommend:   No surgery or  intervention at this point in time.  IVC filter is not indicated at present.  Patient's duplex ultrasound of the venous system shows DVT from the calf vein to the femoral veins.  The patient is initiated on Eliquis, with the understanding that he will be on anticoagulation for at least 12 months.  Elevation was stressed, use of a recliner was discussed.  I have had a long discussion with the patient regarding DVT and post phlebitic changes such as swelling and why it  causes symptoms such as pain.  The patient will wear graduated compression stockings class 1 (20-30 mmHg), beginning after three full days of  anticoagulation, on a daily basis a prescription was given. The patient will  beginning wearing the stockings first thing in the morning and removing them in the evening. The patient is instructed specifically not to sleep in the stockings.  In addition, behavioral modification including elevation during the day and avoidance of prolonged dependency will be initiated.    The patient will continue anticoagulation for now as there have not been any problems or complications at this point.   Per the patient's request we will repeat an ultrasound at his earliest convenience.  2. Acute pulmonary embolism, unspecified pulmonary embolism type, unspecified whether acute cor pulmonale present [facility]) Patient has no evidence of long-lasting effects.  Patient endorses some shortness of breath with extended activity, however no shortness of breath at baseline.  Patient is currently on Eliquis and will continue Eliquis therapy for at least 12 months due to extensive involvement of DVT as well as pulmonary emboli  3. Primary osteoarthritis of left knee Continue NSAID medications as already ordered, these medications have been reviewed and there are no changes at this time.  Continued activity and therapy was stressed.   4. Benign essential hypertension Continue antihypertensive medications as already  ordered, these medications have been reviewed and there are no changes at this time.    Current Outpatient Medications on File Prior to Visit  Medication Sig Dispense Refill  . apixaban (ELIQUIS) 5 MG TABS tablet Take 2 tabs twice a day for 5 days and then 1 tab twice a day starting 03/07/2018 60 tablet 4  . clindamycin (CLEOCIN) 300 MG capsule Take 300 mg by mouth 3 (three) times daily.    . metoprolol succinate (TOPROL-XL) 100 MG 24 hr tablet     . oxyCODONE-acetaminophen (PERCOCET/ROXICET) 5-325 MG tablet Take 1 tablet by mouth every 6 (six) hours as needed for severe pain. 20 tablet 0  . sildenafil (VIAGRA) 100 MG tablet TK 1 T PO UTD AT LEAST 2 H PRIOR TO INTERCOURSE  2  . triamterene-hydrochlorothiazide (DYAZIDE) 37.5-25 MG capsule Take 1 capsule by mouth daily.     No current facility-administered medications on file prior to visit.     There are no Patient Instructions on file for this visit. No follow-ups on file.   Georgiana Spinner, NP  This note was completed with Office manager.  Any errors are purely unintentional.

## 2018-03-29 ENCOUNTER — Encounter (INDEPENDENT_AMBULATORY_CARE_PROVIDER_SITE_OTHER): Payer: Self-pay | Admitting: Nurse Practitioner

## 2018-03-29 NOTE — Progress Notes (Signed)
Subjective:    Patient ID: Hunter Hobbs, male    DOB: 04/29/1961, 57 y.o.   MRN: [id number] Chief Complaint  Patient presents with  . Follow-up    ulltrasound follow up    HPI  Hunter Hobbs is a 57 y.o. male that underwent thrombolysis of a left lower extremity DVT on 03/01/2018.  He underwent mechanical thrombectomy of the left popliteal superficial femoral vein and common femoral vein.  He is following up with on a requested ultrasound.  Patient endorses continuing to wear his medical grade 1 compression socks on a daily basis.  He states that his shortness of breath is much less while ambulating.  He endorses some pain with ambulation.  He denies any fever, chills, nausea vomiting.  He denies any shortness of breath or chest pain.  He has not had a prior history of DVT before this incident.  He has had no prior vascular interventions.  He underwent a DVT study on his left lower extremity which revealed findings consistent with a chronic deep vein pulses including his left femoral vein left popliteal vein and left posterior tibial vein.  There is no evidence of superficial venous thrombosis. Past Medical History:  Diagnosis Date  . Arthritis   . Hypertension     Past Surgical History:  Procedure Laterality Date  . KNEE SURGERY    . PERIPHERAL VASCULAR THROMBECTOMY Left 03/01/2018   Procedure: PERIPHERAL VASCULAR THROMBECTOMY;  Surgeon: Renford DillsSchnier, Gregory G, MD;  Location: [facility] INVASIVE CV LAB;  Service: Cardiovascular;  Laterality: Left;    Social History   Socioeconomic History  . Marital status: Married    Spouse name: Not on file  . Number of children: Not on file  . Years of education: Not on file  . Highest education level: Not on file  Occupational History  . Not on file  Social Needs  . Financial resource strain: Not on file  . Food insecurity:    Worry: Not on file    Inability: Not on file  . Transportation needs:    Medical: Not on file    Non-medical: Not  on file  Tobacco Use  . Smoking status: Never Smoker  . Smokeless tobacco: Never Used  Substance and Sexual Activity  . Alcohol use: Not Currently    Frequency: Never  . Drug use: Never  . Sexual activity: Not on file  Lifestyle  . Physical activity:    Days per week: Not on file    Minutes per session: Not on file  . Stress: Not on file  Relationships  . Social connections:    Talks on phone: Not on file    Gets together: Not on file    Attends religious service: Not on file    Active member of club or organization: Not on file    Attends meetings of clubs or organizations: Not on file    Relationship status: Not on file  . Intimate partner violence:    Fear of current or ex partner: Not on file    Emotionally abused: Not on file    Physically abused: Not on file    Forced sexual activity: Not on file  Other Topics Concern  . Not on file  Social History Narrative  . Not on file    History reviewed. No pertinent family history.  Allergies  Allergen Reactions  . Iodinated Diagnostic Agents Anaphylaxis  . Amlodipine     dizziness     Review of Systems  Review of Systems: Negative Unless Checked Constitutional: [] Weight loss  [] Fever  [] Chills Cardiac: [] Chest pain   []  Atrial Fibrillation  [] Palpitations   [] Shortness of breath when laying flat   [] Shortness of breath with exertion. Vascular:  [x] Pain in legs with walking   [x] Pain in legs with standing  [x] History of DVT   [] Phlebitis   [] Swelling in legs   [] Varicose veins   [] Non-healing ulcers Pulmonary:   [] Uses home oxygen   [] Productive cough   [] Hemoptysis   [] Wheeze  [] COPD   [] Asthma Neurologic:  [] Dizziness   [] Seizures   [] History of stroke   [] History of TIA  [] Aphasia   [] Vissual changes   [] Weakness or numbness in arm   [] Weakness or numbness in leg Musculoskeletal:   [] Joint swelling   [] Joint pain   [] Low back pain  []  History of Knee Replacement Hematologic:  [] Easy bruising  [] Easy bleeding    [] Hypercoagulable state   [] Anemic Gastrointestinal:  [] Diarrhea   [] Vomiting  [] Gastroesophageal reflux/heartburn   [] Difficulty swallowing. Genitourinary:  [] Chronic kidney disease   [] Difficult urination  [] Anuric   [] Blood in urine Skin:  [] Rashes   [] Ulcers  Psychological:  [] History of anxiety   []  History of major depression  []  Memory Difficulties     Objective:   Physical Exam  BP 130/79 (BP Location: Right Arm)   Pulse 60   Resp 17   Ht 6\' 7"  (2.007 m)   Wt (!) 413 lb 12.8 oz (187.7 kg)   BMI 46.62 kg/m   Gen: WD/WN, NAD Head: [place]/AT, No temporalis wasting.  Ear/Nose/Throat: Hearing grossly intact, nares w/o erythema or drainage Eyes: PER, EOMI, sclera nonicteric.  Neck: Supple, no masses.  No JVD.  Pulmonary:  Good air movement, no use of accessory muscles.  Cardiac: RRR Vascular:  Vessel Right Left  Radial Palpable Palpable  Dorsalis Pedis Palpable Palpable  Posterior Tibial Palpable Palpable   Gastrointestinal: soft, non-distended. No guarding/no peritoneal signs.  Musculoskeletal: M/S 5/5 throughout.  No deformity or atrophy.  Neurologic: Pain and light touch intact in extremities.  Symmetrical.  Speech is fluent. Motor exam as listed above. Psychiatric: Judgment intact, Mood & affect appropriate for pt's clinical situation. Dermatologic: No Venous rashes. No Ulcers Noted.  No changes consistent with cellulitis. Lymph : No Cervical lymphadenopathy, no lichenification or skin changes of chronic lymphedema.      Assessment & Plan:   1. DVT femoral (deep venous thrombosis) with thrombophlebitis, left (HCC) He underwent a DVT study on his left lower extremity which revealed findings consistent with a chronic deep vein pulses including his left femoral vein left popliteal vein and left posterior tibial vein.  There is no evidence of superficial venous thrombosis.   Recommend:   No surgery or intervention at this point in time.  IVC filter is not indicated at  present.  The patient is initiated on anticoagulation   Elevation was stressed, use of a recliner was discussed.  I have had a long discussion with the patient regarding DVT and post phlebitic changes such as swelling and why it  causes symptoms such as pain.  The patient will wear graduated compression stockings class 1 (20-30 mmHg), beginning after three full days of anticoagulation, on a daily basis a prescription was given. The patient will  beginning wearing the stockings first thing in the morning and removing them in the evening. The patient is instructed specifically not to sleep in the stockings.  In addition, behavioral modification including elevation during  the day and avoidance of prolonged dependency will be initiated.    The patient will continue anticoagulation for now as there have not been any problems or complications at this point.   - VAS [place] LOWER EXTREMITY VENOUS (DVT); Future  2. Acute pulmonary embolism, unspecified pulmonary embolism type, unspecified whether acute cor pulmonale present [facility]) The patient is currently on Eliquis and has issues with taking it.  The patient has no residual shortness of breath or chest pain following pulmonary embolism.  The patient will remain on Eliquis for at least 12 months.  3. Benign essential hypertension Continue antihypertensive medications as already ordered, these medications have been reviewed and there are no changes at this time.   4. Primary osteoarthritis of left knee Continue NSAID medications as already ordered, these medications have been reviewed and there are no changes at this time.  Continued activity and therapy was stressed.   Current Outpatient Medications on File Prior to Visit  Medication Sig Dispense Refill  . apixaban (ELIQUIS) 5 MG TABS tablet Take 2 tabs twice a day for 5 days and then 1 tab twice a day starting 03/07/2018 60 tablet 4  . clindamycin (CLEOCIN) 300 MG capsule Take 300 mg by mouth 3  (three) times daily.    . metoprolol succinate (TOPROL-XL) 100 MG 24 hr tablet     . oxyCODONE-acetaminophen (PERCOCET/ROXICET) 5-325 MG tablet Take 1 tablet by mouth every 6 (six) hours as needed for severe pain. 20 tablet 0  . sildenafil (VIAGRA) 100 MG tablet TK 1 T PO UTD AT LEAST 2 H PRIOR TO INTERCOURSE  2  . triamterene-hydrochlorothiazide (DYAZIDE) 37.5-25 MG capsule Take 1 capsule by mouth daily.     No current facility-administered medications on file prior to visit.     There are no Patient Instructions on file for this visit. No follow-ups on file.   Georgiana Spinner, NP  This note was completed with Office manager.  Any errors are purely unintentional.

## 2018-03-30 ENCOUNTER — Encounter (INDEPENDENT_AMBULATORY_CARE_PROVIDER_SITE_OTHER): Payer: Self-pay | Admitting: Nurse Practitioner

## 2018-03-30 NOTE — Telephone Encounter (Signed)
He should contact his PCP for follow up of his UTI.  We have only seen him for his DVT.

## 2018-04-06 ENCOUNTER — Encounter (INDEPENDENT_AMBULATORY_CARE_PROVIDER_SITE_OTHER): Payer: Self-pay | Admitting: Nurse Practitioner

## 2018-04-18 ENCOUNTER — Encounter (INDEPENDENT_AMBULATORY_CARE_PROVIDER_SITE_OTHER): Payer: Self-pay | Admitting: Nurse Practitioner

## 2018-06-08 ENCOUNTER — Ambulatory Visit (INDEPENDENT_AMBULATORY_CARE_PROVIDER_SITE_OTHER): Payer: Commercial Managed Care - PPO

## 2018-06-08 ENCOUNTER — Ambulatory Visit (INDEPENDENT_AMBULATORY_CARE_PROVIDER_SITE_OTHER): Payer: Commercial Managed Care - PPO | Admitting: Nurse Practitioner

## 2018-06-08 ENCOUNTER — Encounter (INDEPENDENT_AMBULATORY_CARE_PROVIDER_SITE_OTHER): Payer: Self-pay | Admitting: Nurse Practitioner

## 2018-06-08 VITALS — BP 147/94 | HR 64 | Resp 18 | Ht 79.0 in | Wt >= 6400 oz

## 2018-06-08 DIAGNOSIS — I82412 Acute embolism and thrombosis of left femoral vein: Secondary | ICD-10-CM

## 2018-06-08 DIAGNOSIS — M1712 Unilateral primary osteoarthritis, left knee: Secondary | ICD-10-CM | POA: Diagnosis not present

## 2018-06-08 DIAGNOSIS — I10 Essential (primary) hypertension: Secondary | ICD-10-CM | POA: Diagnosis not present

## 2018-06-08 MED ORDER — APIXABAN 5 MG PO TABS: ORAL_TABLET | ORAL | 12 refills | Status: DC

## 2018-06-14 ENCOUNTER — Encounter (INDEPENDENT_AMBULATORY_CARE_PROVIDER_SITE_OTHER): Payer: Self-pay | Admitting: Nurse Practitioner

## 2018-06-14 NOTE — Progress Notes (Signed)
Subjective:    Patient ID: Hunter Hobbs, male    DOB: 12/19/1960, 58 y.o.   MRN: [id number] Chief Complaint  Patient presents with  . Follow-up    HPI  Hunter SalinaMichael Ayo is a 58 y.o. male day for follow-up studies to assess DVT.  The patient was previously diagnosed with a DVT after presenting to the hospital with shortness of breath.  It was found that he had a pulmonary embolus with occlusive DVT of the left superficial femoral vein, popliteal and common femoral veins.  He subsequently underwent thrombectomy on 03/01/2018.  Since her last visit, the patient has continued to wear medical grade 1 compression stockings on a daily basis.  He also elevates frequently as well as exercises on a daily basis.  He denies pain due to DVT, however he does endorse some knee pain which he utilizes NSAIDs for.  He denies any issues with his anticoagulation.  He denies any nosebleeds, melena, or bright red blood per rectum.  He denies any fever, chills, nausea, vomiting or diarrhea.  He denies any chest pain or shortness of breath.  He continues to have some swelling, but it is mostly within the ankle area.  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  These are consistent with the previous exam done on 03/27/2018.  However it is less than the initial finding on 02/26/2018.  There is no evidence of superficial venous thrombosis.  Past Medical History:  Diagnosis Date  . Arthritis   . Hypertension     Past Surgical History:  Procedure Laterality Date  . KNEE SURGERY    . PERIPHERAL VASCULAR THROMBECTOMY Left 03/01/2018   Procedure: PERIPHERAL VASCULAR THROMBECTOMY;  Surgeon: Renford DillsSchnier, Gregory G, MD;  Location: [facility];  Service: Cardiovascular;  Laterality: Left;    Social History   Socioeconomic History  . Marital status: Married    Spouse name: Not on file  .  Number of children: Not on file  . Years of education: Not on file  . Highest education level: Not on file  Occupational History  . Not on file  Social Needs  . Financial resource strain: Not on file  . Food insecurity:    Worry: Not on file    Inability: Not on file  . Transportation needs:    Medical: Not on file    Non-medical: Not on file  Tobacco Use  . Smoking status: Never Smoker  . Smokeless tobacco: Never Used  Substance and Sexual Activity  . Alcohol use: Not Currently    Frequency: Never  . Drug use: Never  . Sexual activity: Not on file  Lifestyle  . Physical activity:    Days per week: Not on file    Minutes per session: Not on file  . Stress: Not on file  Relationships  . Social connections:    Talks on phone: Not on file    Gets together: Not on file    Attends religious service: Not on file    Active member of club or organization: Not on file    Attends meetings of clubs or organizations: Not on file    Relationship status: Not on file  . Intimate partner violence:    Fear of current or ex partner: Not on file    Emotionally abused: Not on file    Physically abused: Not on file  Forced sexual activity: Not on file  Other Topics Concern  . Not on file  Social History Narrative  . Not on file    No family history on file.  Allergies  Allergen Reactions  . Iodinated Diagnostic Agents Anaphylaxis  . Amlodipine     dizziness     Review of Systems   Review of Systems: Negative Unless Checked Constitutional: [] Weight loss  [] Fever  [] Chills Cardiac: [] Chest pain   []  Atrial Fibrillation  [] Palpitations   [] Shortness of breath when laying flat   [] Shortness of breath with exertion. [] Shortness of breath at rest Vascular:  [] Pain in legs with walking   [] Pain in legs with standing [] Pain in legs when laying flat   [] Claudication    [] Pain in feet when laying flat    [x] History of DVT   [] Phlebitis   [x] Swelling in legs   [] Varicose veins    [] Non-healing ulcers Pulmonary:   [] Uses home oxygen   [] Productive cough   [] Hemoptysis   [] Wheeze  [] COPD   [] Asthma Neurologic:  [] Dizziness   [] Seizures  [] Blackouts [] History of stroke   [] History of TIA  [] Aphasia   [] Temporary Blindness   [] Weakness or numbness in arm   [] Weakness or numbness in leg Musculoskeletal:   [x] Joint swelling   [x] Joint pain   [] Low back pain  []  History of Knee Replacement [] Arthritis [] back Surgeries  []  Spinal Stenosis    Hematologic:  [] Easy bruising  [] Easy bleeding   [] Hypercoagulable state   [] Anemic Gastrointestinal:  [] Diarrhea   [] Vomiting  [] Gastroesophageal reflux/heartburn   [] Difficulty swallowing. [] Abdominal pain Genitourinary:  [] Chronic kidney disease   [] Difficult urination  [] Anuric   [] Blood in urine [] Frequent urination  [] Burning with urination   [] Hematuria Skin:  [] Rashes   [] Ulcers [] Wounds Psychological:  [x] History of anxiety   []  History of major depression  []  Memory Difficulties     Objective:   Physical Exam  BP (!) 147/94 (BP Location: Right Wrist, Patient Position: Sitting, Cuff Size: Normal)   Pulse 64   Resp 18   Ht 6\' 7"  (2.007 m)   Wt (!) 409 lb 9.6 oz (185.8 kg)   BMI 46.14 kg/m   Gen: WD/WN, NAD Head: Nordic/AT, No temporalis wasting.  Ear/Nose/Throat: Hearing grossly intact, nares w/o erythema or drainage Eyes: PER, EOMI, sclera nonicteric.  Neck: Supple, no masses.  No JVD.  Pulmonary:  Good air movement, no use of accessory muscles.  Cardiac: RRR Vascular:  Vessel Right Left  Radial Palpable Palpable   Gastrointestinal: soft, non-distended. No guarding/no peritoneal signs.  Musculoskeletal: M/S 5/5 throughout.  No deformity or atrophy.  Neurologic: Pain and light touch intact in extremities.  Symmetrical.  Speech is fluent. Motor exam as listed above. Psychiatric: Judgment intact, Mood & affect appropriate for pt's clinical situation. Dermatologic: No Venous rashes. No Ulcers Noted.  No changes consistent  with cellulitis. Lymph : No Cervical lymphadenopathy, no lichenification or skin changes of chronic lymphedema.      Assessment & Plan:   1. Acute deep vein thrombosis (DVT) of femoral vein of left lower extremity (HCC) An extended discussion with the patient as to the difference between an acute versus chronic DVT.  The patient is extremely anxious about the possibility of development of another pulmonary embolism while he still has a chronic DVT present.  I explained to him in depth that Riley Nearing is very unlikely to happen, however he is still anxious.,  Therefore after discussion with the patient, he will remain  on anticoagulation for 1 year and we will bring him back at that time for a follow-up DVT study to determine how much of the chronic DVT has resolved.  Also explained to the patient that it could take an extensive time due to the fact that he had a very large, extensive thrombus.  Patient understood and is in agreement.  Also cautioned patient to wear medical grade 1 compression stockings on a daily basis.  The patient was given instructions to wear his compression stockings first thing in the morning and to remove the evening with specific instructions not to sleep in them.  He was also instructed to be especially sure to wear them when he is driving for long distances, as well as to stop and take frequent breaks every 2-3 hours.  2. Benign essential hypertension Continue antihypertensive medications as already ordered, these medications have been reviewed and there are no changes at this time.   3. Primary osteoarthritis of left knee Continue NSAID medications as already ordered, these medications have been reviewed and there are no changes at this time.  Continued activity and therapy was stressed.   Current Outpatient Medications on File Prior to Visit  Medication Sig Dispense Refill  . metoprolol succinate (TOPROL-XL) 100 MG 24 hr tablet     . sildenafil (VIAGRA) 100 MG tablet  TK 1 T PO UTD AT LEAST 2 H PRIOR TO INTERCOURSE  2  . triamterene-hydrochlorothiazide (DYAZIDE) 37.5-25 MG capsule Take 1 capsule by mouth daily.     No current facility-administered medications on file prior to visit.     There are no Patient Instructions on file for this visit. No follow-ups on file.   Georgiana SpinnerFallon E Kalil, NP  This note was completed with Office managerDragon Dictation.  Any errors are purely unintentional.

## 2018-06-28 ENCOUNTER — Ambulatory Visit (INDEPENDENT_AMBULATORY_CARE_PROVIDER_SITE_OTHER): Payer: 59 | Admitting: Nurse Practitioner

## 2018-06-28 ENCOUNTER — Encounter (INDEPENDENT_AMBULATORY_CARE_PROVIDER_SITE_OTHER): Payer: 59

## 2018-07-24 ENCOUNTER — Encounter (INDEPENDENT_AMBULATORY_CARE_PROVIDER_SITE_OTHER): Payer: Self-pay

## 2018-07-31 ENCOUNTER — Encounter (INDEPENDENT_AMBULATORY_CARE_PROVIDER_SITE_OTHER): Payer: Self-pay

## 2018-07-31 ENCOUNTER — Other Ambulatory Visit (INDEPENDENT_AMBULATORY_CARE_PROVIDER_SITE_OTHER): Payer: Self-pay | Admitting: Nurse Practitioner

## 2018-07-31 MED ORDER — APIXABAN 5 MG PO TABS: ORAL_TABLET | ORAL | 12 refills | Status: DC

## 2018-09-06 ENCOUNTER — Encounter (INDEPENDENT_AMBULATORY_CARE_PROVIDER_SITE_OTHER): Payer: Self-pay

## 2018-09-30 ENCOUNTER — Other Ambulatory Visit: Payer: Self-pay

## 2018-09-30 ENCOUNTER — Emergency Department
Admission: EM | Admit: 2018-09-30 | Discharge: 2018-09-30 | Disposition: A | Discharge status: Home / Self Care | Payer: Managed Care, Other (non HMO) | Attending: Emergency Medicine | Admitting: Emergency Medicine

## 2018-09-30 ENCOUNTER — Emergency Department: Payer: Managed Care, Other (non HMO)

## 2018-09-30 ENCOUNTER — Encounter: Payer: Self-pay | Admitting: Emergency Medicine

## 2018-09-30 DIAGNOSIS — I10 Essential (primary) hypertension: Secondary | ICD-10-CM | POA: Diagnosis not present

## 2018-09-30 DIAGNOSIS — Z7901 Long term (current) use of anticoagulants: Secondary | ICD-10-CM | POA: Insufficient documentation

## 2018-09-30 DIAGNOSIS — R0789 Other chest pain: Secondary | ICD-10-CM | POA: Insufficient documentation

## 2018-09-30 LAB — CBC
HCT: 43.8 % (ref 39.0–52.0)
Hemoglobin: 15 g/dL (ref 13.0–17.0)
MCH: 29.1 pg (ref 26.0–34.0)
MCHC: 34.2 g/dL (ref 30.0–36.0)
MCV: 84.9 fL (ref 80.0–100.0)
Platelets: 233 10*3/uL (ref 150–400)
RBC: 5.16 MIL/uL (ref 4.22–5.81)
RDW: 14.3 % (ref 11.5–15.5)
WBC: 9.9 10*3/uL (ref 4.0–10.5)
nRBC: 0 % (ref 0.0–0.2)

## 2018-09-30 LAB — BASIC METABOLIC PANEL
Anion gap: 11 (ref 5–15)
BUN: 16 mg/dL (ref 6–20)
CO2: 22 mmol/L (ref 22–32)
Calcium: 9.1 mg/dL (ref 8.9–10.3)
Chloride: 103 mmol/L (ref 98–111)
Creatinine, Ser: 1.1 mg/dL (ref 0.61–1.24)
GFR calc Af Amer: >60 mL/min (ref >60)
GFR calc non Af Amer: >60 mL/min (ref >60)
Glucose, Bld: 177 mg/dL — ABNORMAL HIGH (ref 70–99)
Potassium: 3.7 mmol/L (ref 3.5–5.1)
Sodium: 136 mmol/L (ref 135–145)

## 2018-09-30 LAB — TROPONIN I: Troponin I: <0.03 ng/mL (ref <0.03)

## 2018-09-30 MED ORDER — SODIUM CHLORIDE 0.9% FLUSH: 3.0000 mL | Freq: Once | INTRAVENOUS | Status: DC

## 2018-09-30 NOTE — ED Provider Notes (Addendum)
[facility] Emergency Department Provider Note  ____________________________________________   I have reviewed the triage vital signs and the nursing notes. Where available I have reviewed prior notes and, if possible and indicated, outside hospital notes.    HISTORY  Chief Complaint Chest Pain    HPI Hunter Hobbs is a 58 y.o. male patient seen and evaluated during the coronavirus epidemic during a time with low staffing with a history of PE, on VQ scan in October DVT, who is he states absolutely compliant with his medication.  He states that he was lying on his left side in bed in a very awkward position and felt that he pulled a muscle on Tuesday.  The pain is been there since that time.  It is worse when he touches it or changes position.  Is a soreness in the chest wall.  He can reproduce it by moving in the bed.  When he sits still, it is minimal but still there.  He states it is not exertional.  Walking actually makes it feel a lot better.  He has no dyspnea or shortness of breath.  He states this is nothing like his prior PE.  It is a aching discomfort with no radiation.  He can specify exactly where it hurts.  He states he has had no shingles or rashes.  He had absolutely no pleuritic component to this.  He is not suffering from increased leg swelling he is followed closely by vascular surgery for his known DVTs.  Patient has an anaphylactic reaction to iodized contrast agents and even to get a VQ scan he had to have a prolonged pretreatment.  He very much would prefer not to have that done.  He has no exercise intolerance.  He states "this is nothing like my PE.".  It was gradual in onset, he noticed it as an ache after he woke up in an uncomfortable position lying uncomfortably on his arm on that side exactly where the pain is.    Past Medical History:  Diagnosis Date  . Arthritis   . Hypertension     Patient Active Problem List   Diagnosis Date Noted  .  Acute pulmonary embolism (HCC) 03/28/2018  . DVT (deep venous thrombosis) (HCC) 02/26/2018  . Osteoarthritis of knee 02/24/2018  . Knee pain, bilateral 12/11/2013  . Benign essential hypertension 12/11/2013    Past Surgical History:  Procedure Laterality Date  . KNEE SURGERY    . PERIPHERAL VASCULAR THROMBECTOMY Left 03/01/2018   Procedure: PERIPHERAL VASCULAR THROMBECTOMY;  Surgeon: Renford Dills, MD;  Location: [facility] INVASIVE CV LAB;  Service: Cardiovascular;  Laterality: Left;    Prior to Admission medications   Medication Sig Start Date End Date Taking? Authorizing Provider  apixaban (ELIQUIS) 5 MG TABS tablet Take 2 tabs twice a day for 5 days and then 1 tab twice a day starting 03/07/2018 07/31/18   Georgiana Spinner, NP  metoprolol succinate (TOPROL-XL) 100 MG 24 hr tablet  03/18/18   [provider]  sildenafil (VIAGRA) 100 MG tablet TK 1 T PO UTD AT LEAST 2 H PRIOR TO INTERCOURSE 02/22/18   [provider]  triamterene-hydrochlorothiazide (DYAZIDE) 37.5-25 MG capsule Take 1 capsule by mouth daily. 06/07/16   [provider]    Allergies Iodinated diagnostic agents and Amlodipine  No family history on file.  Social History Social History   Tobacco Use  . Smoking status: Never Smoker  . Smokeless tobacco: Never Used  Substance Use Topics  .  Alcohol use: Not Currently    Frequency: Never  . Drug use: Never    Review of Systems Constitutional: No fever/chills Eyes: No visual changes. ENT: No sore throat. No stiff neck no neck pain Cardiovascular: + chest pain. Respiratory: Denies shortness of breath. Gastrointestinal:   no vomiting.  No diarrhea.  No constipation. Genitourinary: Negative for dysuria. Musculoskeletal: Negative lower extremity swelling Skin: Negative for rash. Neurological: Negative for severe headaches, focal weakness or numbness.   ____________________________________________   PHYSICAL EXAM:  VITAL SIGNS: ED  Triage Vitals  Enc Vitals Group     BP 09/30/18 2118 137/88     Pulse Rate 09/30/18 2118 75     Resp 09/30/18 2118 18     Temp 09/30/18 2118 98.1 F (36.7 C)     Temp Source 09/30/18 2118 Oral     SpO2 09/30/18 2118 95 %     Weight 09/30/18 2115 (!) 400 lb (181.4 kg)     Height 09/30/18 2115  (2.007 m)     Head Circumference --      Peak Flow --      Pain Score 09/30/18 2114 7     Pain Loc --      Pain Edu? --      Excl. in GC? --     Constitutional: Alert and oriented. Well appearing and in no acute distress. Eyes: Conjunctivae are normal Head: Atraumatic HEENT: No congestion/rhinnorhea. Mucous membranes are moist.  Oropharynx non-erythematous Neck:   Nontender with no meningismus, no masses, no stridor Cardiovascular: Normal rate, regular rhythm. Grossly normal heart sounds.  Good peripheral circulation. Respiratory: Normal respiratory effort.  No retractions. Lungs CTAB. Chest: Tender palpation to the left chest wall no shingles noted, it is minimal discomfort no crepitus no flail chest, patient can reproduce the pain by changing position and pulling up in the bed. Abdominal: Soft and nontender. No distention. No guarding no rebound Back:  There is no focal tenderness or step off.  there is no midline tenderness there are no lesions noted. there is no CVA tenderness Musculoskeletal: No lower extremity tenderness, no upper extremity tenderness. No joint effusions, no DVT signs strong distal pulses no edema Neurologic:  Normal speech and language. No gross focal neurologic deficits are appreciated.  Skin:  Skin is warm, dry and intact. No rash noted. Psychiatric: Mood and affect are normal. Speech and behavior are normal.  ____________________________________________   LABS (all labs ordered are listed, but only abnormal results are displayed)  Labs Reviewed  BASIC METABOLIC PANEL - Abnormal; Notable for the following components:      Result Value   Glucose, Bld 177  (*)    All other components within normal limits  CBC  TROPONIN I    Pertinent labs  results that were available during my care of the patient were reviewed by me and considered in my medical decision making (see chart for details). ____________________________________________  EKG  I personally interpreted any EKGs ordered by me or triage Sinus rhythm rate 73 bpm, first-degree AV block, no acute ST elevation or depression, ____________________________________________  RADIOLOGY  Pertinent labs & imaging results that were available during my care of the patient were reviewed by me and considered in my medical decision making (see chart for details). If possible, patient and/or family made aware of any abnormal findings.  Dg Chest 2 View  Result Date: 09/30/2018 CLINICAL DATA:  Chest pain EXAM: CHEST - 2 VIEW COMPARISON:  03/15/2018 FINDINGS: The heart size  and mediastinal contours are within normal limits. Both lungs are clear. Disc degenerative disease of the thoracic spine. IMPRESSION: No acute abnormality of the lungs. Electronically Signed   By: Lauralyn PrimesAlex  Bibbey M.D.   On: 09/30/2018 21:59   ____________________________________________    PROCEDURES  Procedure(s) performed: None  Procedures  Critical Care performed: None  ____________________________________________   INITIAL IMPRESSION / ASSESSMENT AND PLAN / ED COURSE  Pertinent labs & imaging results that were available during my care of the patient were reviewed by me and considered in my medical decision making (see chart for details).  Patient with a chest wall discomfort.  Low suspicion for ACS troponin EKG do not show any acute ischemia despite 4 days of constant discomfort which is actually gradually improving.  Patient is commenced this is a muscle strain he just wanted to "be sure".  Is consistent certainly with that.  Patient does not have anything to suggest dissection, there is no radiation of the pain was not  tearing with not maximum intensity at onset has good strong distal pulses.  In this patient PE would be the most concerning, however, patient feels strongly that this is "nothing like my PE" I did state that there is no way I can rule out a PE without a VQ scan, and he does not want to have it.  He states that he does not want to stay for the VQ scan he does not want to have a steroid prep he does not want to have the VQ scan even if we did not have to do a any prep.  Patient did require steroid prep the time before.  There certainly risk to doing the VQ scan there is risk of not doing a VQ scan.  I have very low suspicion the patient has any sizable PE as he is absolutely no pleuritic symptoms, no shortness of breath and his symptoms get better when he walks around.  There is no evidence of right heart strain clinically.  He is already on significant anticoagulation, and the pain is very reproducible and atypical for PE.  Nonetheless I did explain to him that PEs are sometimes deadly and that without doing further imaging there is no way for me to know for sure that he is not in danger.  He understands that but states "I do not want the VQ scan I just want to go home".  Feel that this is unreasonable as most likely if he did have a small PE there be no significant change in management and he is having completely capable of understanding the risk benefits and alternatives both of getting more imaging staying for serial enzymes or just going home.  Patient is concerned about possibly picking up the coronavirus in the emergency department as that is not an unreasonable concern given that we are in the height of the coronavirus pandemic.  There have been multiple patients with that disease in the department.  I have low suspicion he has it at this time fortunately.  Given that the patient refuses therefore further care and obviously shared decision making is based upon patient participation, we will discharge him with  extensive return precautions and follow-up given understood.  ----------------------------------------- 11:13 PM on 09/30/2018 -----------------------------------------  Have again addressed the situation with the patient he does not feel that he wants to stay for VQ scan or further imaging or further troponins he would like to be discharged, he feels this is musculoskeletal which most likely is but he  understands limitations of our work-up given these constraints and we will discharge.    ____________________________________________   FINAL CLINICAL IMPRESSION(S) / ED DIAGNOSES  Final diagnoses:  None      This chart was dictated using voice recognition software.  Despite best efforts to proofread,  errors can occur which can change meaning.      Jeanmarie Plant, MD 09/30/18 2216    Jeanmarie Plant, MD 09/30/18 [phone]

## 2018-09-30 NOTE — ED Notes (Signed)
Patient states that he does not want to stay for the VQ scan or serial enzymes at this time.

## 2018-09-30 NOTE — ED Notes (Signed)
Pt unable to have CT scan due to allergy to dye. Pt refuses to wait for alternative scan due to time constraints and extra medications that he states would eventually "end up on eliquis anyways". Pt states that he is ready to go home.

## 2018-09-30 NOTE — Discharge Instructions (Addendum)
You would prefer not to stay for VQ scan or further testing which is certainly your choice but does as we discussed limit our ability to further evaluate you.  If you have increased pain, shortness of breath you feel worse about anything or you change your mind about further imaging return to the emergency room.  Otherwise please follow with your primary care doctor and your vascular surgeon and do not feel to continue taking her Eliquis as prescribed.

## 2018-09-30 NOTE — ED Triage Notes (Signed)
Pt arrives POV with chest pain since Thursday. Pt reports no other cardiac symptoms at this time. Pt states that he had a DVT in October and is still on eliquis at this time. Pt is in NAD.

## 2018-09-30 NOTE — ED Notes (Signed)
Pt resting in bed in NAD. VSS. Ready to go home.

## 2018-09-30 NOTE — ED Notes (Signed)
Patient transported to X-ray 

## 2019-01-18 ENCOUNTER — Other Ambulatory Visit: Payer: Self-pay

## 2019-01-18 ENCOUNTER — Encounter: Payer: Self-pay | Admitting: Emergency Medicine

## 2019-01-18 ENCOUNTER — Emergency Department
Admission: EM | Admit: 2019-01-18 | Discharge: 2019-01-18 | Disposition: A | Discharge status: Home / Self Care | Payer: Managed Care, Other (non HMO) | Attending: Emergency Medicine | Admitting: Emergency Medicine

## 2019-01-18 DIAGNOSIS — I2699 Other pulmonary embolism without acute cor pulmonale: Secondary | ICD-10-CM | POA: Insufficient documentation

## 2019-01-18 DIAGNOSIS — H1033 Unspecified acute conjunctivitis, bilateral: Secondary | ICD-10-CM | POA: Diagnosis not present

## 2019-01-18 DIAGNOSIS — H109 Unspecified conjunctivitis: Secondary | ICD-10-CM

## 2019-01-18 DIAGNOSIS — I10 Essential (primary) hypertension: Secondary | ICD-10-CM | POA: Diagnosis not present

## 2019-01-18 DIAGNOSIS — H5712 Ocular pain, left eye: Secondary | ICD-10-CM | POA: Diagnosis not present

## 2019-01-18 DIAGNOSIS — I82409 Acute embolism and thrombosis of unspecified deep veins of unspecified lower extremity: Secondary | ICD-10-CM | POA: Diagnosis not present

## 2019-01-18 DIAGNOSIS — H5713 Ocular pain, bilateral: Secondary | ICD-10-CM | POA: Diagnosis present

## 2019-01-18 HISTORY — DX: Personal history of other venous thrombosis and embolism: Z86.718

## 2019-01-18 MED ORDER — GATIFLOXACIN 0.5 % OP SOLN: 1.0000 [drp] | Freq: Four times a day (QID) | OPHTHALMIC | 0 refills | Status: DC

## 2019-01-18 MED ORDER — KETOROLAC TROMETHAMINE 0.5 % OP SOLN: 1.0000 [drp] | Freq: Four times a day (QID) | OPHTHALMIC | 0 refills | Status: DC

## 2019-01-18 MED ORDER — FLUORESCEIN SODIUM 1 MG OP STRP
2.0000 | ORAL_STRIP | Freq: Once | OPHTHALMIC | Status: AC
  Administered 2019-01-18: 09:00:00 2 via OPHTHALMIC
  Filled 2019-01-18: qty 2

## 2019-01-18 MED ORDER — TETRACAINE HCL 0.5 % OP SOLN
2.0000 [drp] | Freq: Once | OPHTHALMIC | Status: AC
  Administered 2019-01-18: 2 [drp] via OPHTHALMIC
  Filled 2019-01-18: qty 4

## 2019-01-18 NOTE — ED Notes (Signed)
Pt c/o BL eye irritation since yesterday with drainage and photosensitivity , denies any injury. Worse on the left eye

## 2019-01-18 NOTE — ED Triage Notes (Signed)
Says he cant open eyes without pain since yest am.

## 2019-01-18 NOTE — ED Provider Notes (Signed)
[facility] Emergency Department Provider Note       Time seen: ----------------------------------------- 9:13 AM on 01/18/2019 -----------------------------------------   I have reviewed the triage vital signs and the nursing notes.  HISTORY   Chief Complaint Eye Problem    HPI Hunter Hobbs is a 58 y.o. male with a history of arthritis, DVT, hypertension, PE who presents to the ED for bilateral eye pain and light sensitivity that is worse in the left eye.  Patient also complains of tearing to his eyes.  Recently treated for a stye in his left eye.  He denies any vision changes but does have severe light sensitivity.  Past Medical History:  Diagnosis Date  . Arthritis   . H/O blood clots   . Hypertension     Patient Active Problem List   Diagnosis Date Noted  . Acute pulmonary embolism (HCC) 03/28/2018  . DVT (deep venous thrombosis) (HCC) 02/26/2018  . Osteoarthritis of knee 02/24/2018  . Knee pain, bilateral 12/11/2013  . Benign essential hypertension 12/11/2013    Past Surgical History:  Procedure Laterality Date  . KNEE SURGERY    . PERIPHERAL VASCULAR THROMBECTOMY Left 03/01/2018   Procedure: PERIPHERAL VASCULAR THROMBECTOMY;  Surgeon: Renford DillsSchnier, Gregory G, MD;  Location: [facility] INVASIVE CV LAB;  Service: Cardiovascular;  Laterality: Left;    Allergies Iodinated diagnostic agents and Amlodipine  Social History Social History   Tobacco Use  . Smoking status: Never Smoker  . Smokeless tobacco: Never Used  Substance Use Topics  . Alcohol use: Not Currently    Frequency: Never  . Drug use: Never   Review of Systems Constitutional: Negative for fever. Eyes: Positive for photophobia and eye pain Cardiovascular: Negative for chest pain. Respiratory: Negative for shortness of breath. Gastrointestinal: Negative for abdominal pain, vomiting and diarrhea. Musculoskeletal: Negative for back pain. Skin: Negative for rash. Neurological:  Negative for headaches, focal weakness or numbness.  All systems negative/normal/unremarkable except as stated in the HPI  ____________________________________________   PHYSICAL EXAM:  VITAL SIGNS: ED Triage Vitals  Enc Vitals Group     BP 01/18/19 0900 (!) 146/93     Pulse Rate 01/18/19 0900 82     Resp 01/18/19 0900 16     Temp 01/18/19 0900 97.9 F (36.6 C)     Temp Source 01/18/19 0900 Oral     SpO2 01/18/19 0900 97 %     Weight 01/18/19 0903 (!) 399 lb 14.6 oz (181.4 kg)     Height 01/18/19 0903 6\' 7"  (2.007 m)     Head Circumference --      Peak Flow --      Pain Score 01/18/19 0902 8     Pain Loc --      Pain Edu? --      Excl. in GC? --    Constitutional: Alert and oriented. Well appearing and in no distress. Eyes: Significant conjunctival injection of the left eye, both eyes examined using fluorescein and tetracaine without any ulceration, dendritic pattern or abrasion.  Tetracaine did alleviate some of the pain in his left eye.  Left pupil appears to be smaller than the right, eye pressures are measuring in the low 20s, 22-23 in both eyes. ENT      Head: Normocephalic and atraumatic.      Nose: No congestion/rhinnorhea.      Mouth/Throat: Mucous membranes are moist. Neurologic:  Normal speech and language. No gross focal neurologic deficits are appreciated.  Skin:  Skin is warm, dry  and intact. No rash noted. Psychiatric: Mood and affect are normal. Speech and behavior are normal.  ____________________________________________  ED COURSE:  As part of my medical decision making, I reviewed the following data within the [facility] History obtained from family if available, nursing notes, old chart and ekg, as well as notes from prior ED visits. Patient presented for eye pain and drainage with photophobia, we will assess with labs as indicated at this time.   Procedures  Hunter Hobbs was evaluated in Emergency Department on 01/18/2019 for the  symptoms described in the history of present illness. He was evaluated in the context of the global COVID-19 pandemic, which necessitated consideration that the patient might be at risk for infection with the SARS-CoV-2 virus that causes COVID-19. Institutional protocols and algorithms that pertain to the evaluation of patients at risk for COVID-19 are in a state of rapid change based on information released by regulatory bodies including the CDC and federal and state organizations. These policies and algorithms were followed during the patient's care in the ED.  ___________________________________________   DIFFERENTIAL DIAGNOSIS   Conjunctivitis, abrasion, iritis, glaucoma  FINAL ASSESSMENT AND PLAN  Conjunctivitis, left eye pain   Plan: The patient had presented for eye pain and sensitivity to light.  Intraocular pressures were at the upper limits of normal or slightly elevated.  He does have photophobia.  I have discussed with ophthalmology who will see him in follow-up tomorrow.  He will be placed on antibacterial eyedrops as well as eyedrops for pain.  He is cleared for outpatient follow-up.   Laurence Aly, MD    Note: This note was generated in part or whole with voice recognition software. Voice recognition is usually quite accurate but there are transcription errors that can and very often do occur. I apologize for any typographical errors that were not detected and corrected.     Earleen Newport, MD 01/18/19 [phone]

## 2019-01-18 NOTE — ED Notes (Signed)
Pt's significant other signed printed d/c paperwork as topaz frozen.

## 2019-01-18 NOTE — ED Triage Notes (Signed)
Woke up yesterday with c/o bilateral eye pain and light sensitivity.  Also c/o tearing to eyes.  Recently treating left eye sty.  Denies vision changes, but c/o sever light sensitivity.

## 2019-02-09 ENCOUNTER — Other Ambulatory Visit: Payer: Self-pay

## 2019-02-09 ENCOUNTER — Ambulatory Visit: Payer: Managed Care, Other (non HMO) | Admitting: Family Medicine

## 2019-02-09 ENCOUNTER — Encounter: Payer: Self-pay | Admitting: Family Medicine

## 2019-02-09 VITALS — BP 128/88 | HR 72 | Temp 95.1°F | Ht 79.0 in | Wt >= 6400 oz

## 2019-02-09 DIAGNOSIS — I10 Essential (primary) hypertension: Secondary | ICD-10-CM | POA: Diagnosis not present

## 2019-02-09 MED ORDER — TRIAMTERENE-HCTZ 37.5-25 MG PO CAPS: 1.0000 | ORAL_CAPSULE | Freq: Every morning | ORAL | 1 refills | Status: DC

## 2019-02-09 MED ORDER — METOPROLOL SUCCINATE ER 100 MG PO TB24: 100.0000 mg | ORAL_TABLET | Freq: Every day | ORAL | 1 refills | Status: DC

## 2019-02-09 NOTE — Progress Notes (Signed)
Chief Complaint  Patient presents with  . New Patient (Initial Visit)       New Patient Visit SUBJECTIVE: HPI: Hunter Hobbs is an 58 y.o.male who is being seen for establishing care.  The patient was previously seen in [place].  Hypertension Patient presents for hypertension follow up. He does monitor home blood pressures. He is compliant with medications. Patient has these side effects of medication: none He is not usually adhering to a healthy diet overall. Exercise: some walking  Pt has dealt w wt issue for quite some time. Weighed 420 lbs last year. 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 Tries to walk, tries to control portions. Sometimes does not eat the healthiest also. Interested in bariatric surgery.   Allergies  Allergen Reactions  . Iodinated Diagnostic Agents Anaphylaxis  . Amlodipine     dizziness    Past Medical History:  Diagnosis Date  . Arthritis   . H/O blood clots   . Hypertension    Past Surgical History:  Procedure Laterality Date  . KNEE SURGERY    . PERIPHERAL VASCULAR THROMBECTOMY Left 03/01/2018   Procedure: PERIPHERAL VASCULAR THROMBECTOMY;  Surgeon: Renford Dills, MD;  Location: [facility] INVASIVE CV LAB;  Service: Cardiovascular;  Laterality: Left;   History reviewed. No pertinent family history. Allergies  Allergen Reactions  . Iodinated Diagnostic Agents Anaphylaxis  . Amlodipine     dizziness    Current Outpatient Medications:  .  apixaban (ELIQUIS) 5 MG TABS tablet, Take 2 tabs twice a day for 5 days and then 1 tab twice a day starting 03/07/2018, Disp: 60 tablet, Rfl: 12 .  metoprolol succinate (TOPROL-XL) 100 MG 24 hr tablet, Take 1 tablet (100 mg total) by mouth daily., Disp: 90 tablet, Rfl: 1 .  sildenafil (VIAGRA) 100 MG tablet, TK 1 T PO UTD AT LEAST 2 H PRIOR TO INTERCOURSE, Disp: , Rfl: 2 .  triamterene-hydrochlorothiazide (DYAZIDE) 37.5-25 MG capsule, Take 1 each (1 capsule total) by mouth  every morning., Disp: 90 capsule, Rfl: 1  ROS Cardiovascular: Denies chest pain  Respiratory: Denies dyspnea   OBJECTIVE: BP 128/88 (BP Location: Left Arm, Patient Position: Sitting, Cuff Size: Large)   Pulse 72   Temp (!) 95.1 F (35.1 C) (Temporal)   Ht 6\' 7"  (2.007 m)   Wt (!) 425 lb 8 oz (193 kg)   SpO2 94%   BMI 47.93 kg/m   Constitutional: -  VS reviewed -  Well developed, well nourished, appears stated age -  No apparent distress  Psychiatric: -  Oriented to person, place, and time -  Memory intact -  Affect and mood normal -  Fluent conversation, good eye contact -  Judgment and insight age appropriate  Eye: -  Conjunctivae clear, no discharge -  Pupils symmetric, round, reactive to light  ENMT: -  MMM    Pharynx moist, no exudate, no erythema  Neck: -  No gross swelling, no palpable masses -  Thyroid midline, not enlarged, mobile, no palpable masses  Cardiovascular: -  RRR -  No LE edema  Respiratory: -  Normal respiratory effort, no accessory muscle use, no retraction -  Breath sounds equal, no wheezes, no ronchi, no crackles  Musculoskeletal: -  No clubbing, no cyanosis -  B/l knees w nml ROM, no effusion b/l  Skin: -  No significant lesion on inspection -  Warm and dry to palpation   ASSESSMENT/PLAN: Essential hypertension - Plan: triamterene-hydrochlorothiazide (  DYAZIDE) 37.5-25 MG capsule, metoprolol succinate (TOPROL-XL) 100 MG 24 hr tablet  Morbid obesity ([place]) - Plan: Amb Referral to Bariatric Surgery  Cont meds. Counseled on diet and exercise. Discussed options for obesity tx. He opted for above. Healthy diet handout given.  He will sometimes have his knees drained and injected. No current issues.  Patient should return in 6 mo for CPE or prn. The patient voiced understanding and agreement to the plan.   [place], DO 02/09/19  8:53 AM

## 2019-02-09 NOTE — Patient Instructions (Signed)
If you do not hear anything about your referral in the next 1-2 weeks, call our office and ask for an update.  Keep the diet clean and stay active.  Aim to do some physical exertion for 150 minutes per week. This is typically divided into 5 days per week, 30 minutes per day. The activity should be enough to get your heart rate up. Anything is better than nothing if you have time constraints.  Healthy Eating Plan Many factors influence your heart health, including eating and exercise habits. Heart (coronary) risk increases with abnormal blood fat (lipid) levels. Heart-healthy meal planning includes limiting unhealthy fats, increasing healthy fats, and making other small dietary changes. This includes maintaining a healthy body weight to help keep lipid levels within a normal range.  WHAT IS MY PLAN?  Your health care provider recommends that you:  Drink a glass of water before meals to help with satiety.  Eat slowly.  An alternative to the water is to add Metamucil. This will help with satiety as well. It does contain calories, unlike water.  WHAT TYPES OF FAT SHOULD I CHOOSE?  Choose healthy fats more often. Choose monounsaturated and polyunsaturated fats, such as olive oil and canola oil, flaxseeds, walnuts, almonds, and seeds.  Eat more omega-3 fats. Good choices include salmon, mackerel, sardines, tuna, flaxseed oil, and ground flaxseeds. Aim to eat fish at least two times each week.  Avoid foods with partially hydrogenated oils in them. These contain trans fats. Examples of foods that contain trans fats are stick margarine, some tub margarines, cookies, crackers, and other baked goods. If you are going to avoid a fat, this is the one to avoid!  WHAT GENERAL GUIDELINES DO I NEED TO FOLLOW?  Check food labels carefully to identify foods with trans fats. Avoid these types of options when possible.  Fill one half of your plate with vegetables and green salads. Eat 4-5 servings of  vegetables per day. A serving of vegetables equals 1 cup of raw leafy vegetables,  cup of raw or cooked cut-up vegetables, or  cup of vegetable juice.  Fill one fourth of your plate with whole grains. Look for the word "whole" as the first word in the ingredient list.  Fill one fourth of your plate with lean protein foods.  Eat 4-5 servings of fruit per day. A serving of fruit equals one medium whole fruit,  cup of dried fruit,  cup of fresh, frozen, or canned fruit. Try to avoid fruits in cups/syrups as the sugar content can be high.  Eat more foods that contain soluble fiber. Examples of foods that contain this type of fiber are apples, broccoli, carrots, beans, peas, and barley. Aim to get 20-30 g of fiber per day.  Eat more home-cooked food and less restaurant, buffet, and fast food.  Limit or avoid alcohol.  Limit foods that are high in starch and sugar.  Avoid fried foods when able.  Cook foods by using methods other than frying. Baking, boiling, grilling, and broiling are all great options. Other fat-reducing suggestions include: ? Removing the skin from poultry. ? Removing all visible fats from meats. ? Skimming the fat off of stews, soups, and gravies before serving them. ? Steaming vegetables in water or broth.  Lose weight if you are overweight. Losing just 5-10% of your initial body weight can help your overall health and prevent diseases such as diabetes and heart disease.  Increase your consumption of nuts, legumes, and seeds to 4-5 servings  per week. One serving of dried beans or legumes equals  cup after being cooked, one serving of nuts equals 1 ounces, and one serving of seeds equals  ounce or 1 tablespoon.  WHAT ARE GOOD FOODS CAN I EAT? Grains Grainy breads (try to find bread that is 3 g of fiber per slice or greater), oatmeal, light popcorn. Whole-grain cereals. Rice and pasta, including [place] rice and those that are made with whole wheat. Edamame pasta is a  great alternative to grain pasta. It has a higher protein content. Try to avoid significant consumption of white bread, sugary cereals, or pastries/baked goods.  Vegetables All vegetables. Cooked white potatoes do not count as vegetables.  Fruits All fruits, but limit pineapple and bananas as these fruits have a higher sugar content.  Meats and Other Protein Sources Lean, well-trimmed beef, veal, pork, and lamb. Chicken and [place] without skin. All fish and shellfish. Wild duck, rabbit, pheasant, and venison. Egg whites or low-cholesterol egg substitutes. Dried beans, peas, lentils, and tofu.Seeds and most nuts.  Dairy Low-fat or nonfat cheeses, including ricotta, string, and mozzarella. Skim or 1% milk that is liquid, powdered, or evaporated. Buttermilk that is made with low-fat milk. Nonfat or low-fat yogurt. Soy/Almond milk are good alternatives if you cannot handle dairy.  Beverages Water is the best for you. Sports drinks with less sugar are more desirable unless you are a highly active athlete.  Sweets and Desserts Sherbets and fruit ices. Honey, jam, marmalade, jelly, and syrups. Dark chocolate.  Eat all sweets and desserts in moderation.  Fats and Oils Nonhydrogenated (trans-free) margarines. Vegetable oils, including soybean, sesame, sunflower, olive, peanut, safflower, corn, canola, and cottonseed. Salad dressings or mayonnaise that are made with a vegetable oil. Limit added fats and oils that you use for cooking, baking, salads, and as spreads.  Other Cocoa powder. Coffee and tea. Most condiments.  The items listed above may not be a complete list of recommended foods or beverages. Contact your dietitian for more options.

## 2019-02-27 ENCOUNTER — Telehealth (INDEPENDENT_AMBULATORY_CARE_PROVIDER_SITE_OTHER): Payer: Self-pay | Admitting: Nurse Practitioner

## 2019-02-27 ENCOUNTER — Encounter (INDEPENDENT_AMBULATORY_CARE_PROVIDER_SITE_OTHER): Payer: Self-pay

## 2019-02-27 NOTE — Telephone Encounter (Signed)
We can get the patient in for a DVT study only.  If it is positive, meaning there is a new blood clot, we will work him in.  If not, meaning that he still has chronic clot as seen before, he can keep his follow up in January.  After having a DVT some people have some post-Dvt changes that can include pain or swelling.  Typically an old DVT wont cause numbness or tingling. There are a multitude of things that can cause numbness or tingling such as issues with arteries, neuropathy or pinched discs in your back.  We can also look at your arteries to see if there are issues with blood flow.  If not, you would need to follow up with your PCP for work up

## 2019-03-12 ENCOUNTER — Other Ambulatory Visit (INDEPENDENT_AMBULATORY_CARE_PROVIDER_SITE_OTHER): Payer: Self-pay | Admitting: Nurse Practitioner

## 2019-03-12 DIAGNOSIS — R200 Anesthesia of skin: Secondary | ICD-10-CM

## 2019-03-12 DIAGNOSIS — I82512 Chronic embolism and thrombosis of left femoral vein: Secondary | ICD-10-CM

## 2019-03-12 DIAGNOSIS — R208 Other disturbances of skin sensation: Secondary | ICD-10-CM

## 2019-03-14 ENCOUNTER — Other Ambulatory Visit: Payer: Self-pay

## 2019-03-14 ENCOUNTER — Telehealth (INDEPENDENT_AMBULATORY_CARE_PROVIDER_SITE_OTHER): Payer: Self-pay

## 2019-03-14 ENCOUNTER — Ambulatory Visit (INDEPENDENT_AMBULATORY_CARE_PROVIDER_SITE_OTHER): Payer: Managed Care, Other (non HMO)

## 2019-03-14 ENCOUNTER — Other Ambulatory Visit (INDEPENDENT_AMBULATORY_CARE_PROVIDER_SITE_OTHER): Payer: Self-pay | Admitting: Nurse Practitioner

## 2019-03-14 ENCOUNTER — Encounter (INDEPENDENT_AMBULATORY_CARE_PROVIDER_SITE_OTHER): Payer: Self-pay | Admitting: Nurse Practitioner

## 2019-03-14 ENCOUNTER — Ambulatory Visit (INDEPENDENT_AMBULATORY_CARE_PROVIDER_SITE_OTHER): Payer: Managed Care, Other (non HMO) | Admitting: Nurse Practitioner

## 2019-03-14 VITALS — BP 146/89 | HR 85 | Resp 16 | Wt >= 6400 oz

## 2019-03-14 DIAGNOSIS — I82512 Chronic embolism and thrombosis of left femoral vein: Secondary | ICD-10-CM

## 2019-03-14 DIAGNOSIS — R200 Anesthesia of skin: Secondary | ICD-10-CM

## 2019-03-14 DIAGNOSIS — M1712 Unilateral primary osteoarthritis, left knee: Secondary | ICD-10-CM | POA: Diagnosis not present

## 2019-03-14 DIAGNOSIS — R208 Other disturbances of skin sensation: Secondary | ICD-10-CM | POA: Diagnosis not present

## 2019-03-14 DIAGNOSIS — I10 Essential (primary) hypertension: Secondary | ICD-10-CM

## 2019-03-14 MED ORDER — RIVAROXABAN 20 MG PO TABS: 20.0000 mg | ORAL_TABLET | Freq: Every day | ORAL | 0 refills | Status: DC

## 2019-03-14 MED ORDER — RIVAROXABAN 20 MG PO TABS: 20.0000 mg | ORAL_TABLET | Freq: Every day | ORAL | 5 refills | Status: DC

## 2019-03-14 NOTE — Telephone Encounter (Signed)
Per the patient preference he can stay on eliquis and we will cancel xarelto with patient's pharmacy.  Can you contact his pharmacy ([facility]) to cancel.  Please let the patient know to not pick up the Rxs.

## 2019-03-14 NOTE — Progress Notes (Addendum)
SUBJECTIVE:  Patient ID: Hunter Hobbs, male    DOB: 09/21/1960, 58 y.o.   MRN: [id number] Chief Complaint  Patient presents with  . Follow-up    ultrasound follow up    HPI  Hunter Hobbs is a 58 y.o. male presents today with complaints of numbness in his left lower extremity.  The patient describes the numbness as feeling like he is walking on something and cannot necessarily feel them.  The patient states that this has been going on for some time however he cannot give me an exact amount of time, however it has been for several months.  He says that it is more annoying than bothersome and he denies any pain associated with it.  The patient has a previous history of an occlusive DVT of the femoral vein with subsequent PE.  The patient underwent thrombectomy however some of these thrombus remain.  The patient is concerned that this could be the cause of his continued numbness.  The patient endorses taking his Eliquis on a daily basis and he denies missing any doses.  The patient denies any claudication-like symptoms or rest pain.  He denies any fever, chills, nausea, vomiting or diarrhea.  He denies any chest pain or shortness of breath.  Patient does endorse tiredness.  Today the patient underwent bilateral ABIs which revealed an ABI of 1.23 on the right and 1.24 on the left.  The patient has triphasic waveforms in the bilateral tibial arteries with strong toe waveforms bilaterally.  The patient also underwent a left lower extremity DVT study which revealed the patient still had chronic thrombus present within his femoral vein.  There is slight improvement in the popliteal vein with partial compression.  This is in comparison to the previous vein on 06/08/2018. Past Medical History:  Diagnosis Date  . Arthritis   . H/O blood clots   . Hypertension     Past Surgical History:  Procedure Laterality Date  . KNEE SURGERY    . PERIPHERAL VASCULAR THROMBECTOMY Left 03/01/2018   Procedure:  PERIPHERAL VASCULAR THROMBECTOMY;  Surgeon: Renford Dills, MD;  Location: [facility];  Service: Cardiovascular;  Laterality: Left;    Social History   Socioeconomic History  . Marital status: Married    Spouse name: Not on file  . Number of children: Not on file  . Years of education: Not on file  . Highest education level: Not on file  Occupational History  . Not on file  Social Needs  . Financial resource strain: Not on file  . Food insecurity    Worry: Not on file    Inability: Not on file  . Transportation needs    Medical: Not on file    Non-medical: Not on file  Tobacco Use  . Smoking status: Never Smoker  . Smokeless tobacco: Never Used  Substance and Sexual Activity  . Alcohol use: Not Currently    Frequency: Never  . Drug use: Never  . Sexual activity: Not on file  Lifestyle  . Physical activity    Days per week: Not on file    Minutes per session: Not on file  . Stress: Not on file  Relationships  . Social Musician on phone: Not on file    Gets together: Not on file    Attends religious service: Not on file    Active member of club or organization: Not on file    Attends meetings of clubs or organizations: Not  on file    Relationship status: Not on file  . Intimate partner violence    Fear of current or ex partner: Not on file    Emotionally abused: Not on file    Physically abused: Not on file    Forced sexual activity: Not on file  Other Topics Concern  . Not on file  Social History Narrative  . Not on file    No family history on file.  Allergies  Allergen Reactions  . Iodinated Diagnostic Agents Anaphylaxis  . Amlodipine     dizziness     Review of Systems   Review of Systems: Negative Unless Checked Constitutional: [] Weight loss  [] Fever  [] Chills Cardiac: [] Chest pain   []  Atrial Fibrillation  [] Palpitations   [] Shortness of breath when laying flat   [] Shortness of breath with exertion. [] Shortness of breath  at rest Vascular:  [] Pain in legs with walking   [] Pain in legs with standing [] Pain in legs when laying flat   [] Claudication    [] Pain in feet when laying flat    [x] History of DVT   [] Phlebitis   [x] Swelling in legs   [] Varicose veins   [] Non-healing ulcers Pulmonary:   [] Uses home oxygen   [] Productive cough   [] Hemoptysis   [] Wheeze  [] COPD   [] Asthma Neurologic:  [] Dizziness   [] Seizures  [] Blackouts [] History of stroke   [] History of TIA  [] Aphasia   [] Temporary Blindness   [] Weakness or numbness in arm   [x] Weakness or numbness in leg Musculoskeletal:   [] Joint swelling   [] Joint pain   [] Low back pain  []  History of Knee Replacement [x] Arthritis [] back Surgeries  []  Spinal Stenosis    Hematologic:  [] Easy bruising  [] Easy bleeding   [] Hypercoagulable state   [] Anemic Gastrointestinal:  [] Diarrhea   [] Vomiting  [] Gastroesophageal reflux/heartburn   [] Difficulty swallowing. [] Abdominal pain Genitourinary:  [] Chronic kidney disease   [] Difficult urination  [] Anuric   [] Blood in urine [] Frequent urination  [] Burning with urination   [] Hematuria Skin:  [] Rashes   [] Ulcers [] Wounds Psychological:  [x] History of anxiety   []  History of major depression  []  Memory Difficulties      OBJECTIVE:   Physical Exam  BP (!) 146/89 (BP Location: Right Arm)   Pulse 85   Resp 16   Wt (!) 421 lb 12.8 oz (191.3 kg)   BMI 47.52 kg/m   Gen: WD/WN, NAD Head: [place]/AT, No temporalis wasting.  Ear/Nose/Throat: Hearing grossly intact, nares w/o erythema or drainage Eyes: PER, EOMI, sclera nonicteric.  Neck: Supple, no masses.  No JVD.  Pulmonary:  Good air movement, no use of accessory muscles.  Cardiac: RRR Vascular: mild swelling  Vessel Right Left  Radial Palpable Palpable  Dorsalis Pedis Palpable Palpable  Posterior Tibial Palpable Palpable   Gastrointestinal: soft, non-distended. No guarding/no peritoneal signs.  Musculoskeletal: M/S 5/5 throughout.  No deformity or atrophy.  Neurologic: Pain  and light touch intact in extremities.  Symmetrical.  Speech is fluent. Motor exam as listed above. Psychiatric: Judgment intact, Mood & affect appropriate for pt's clinical situation. Dermatologic: No Venous rashes. No Ulcers Noted.  No changes consistent with cellulitis. Lymph : No Cervical lymphadenopathy, no lichenification or skin changes of chronic lymphedema.       ASSESSMENT AND PLAN:  1. 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  The patient is extremely upset that he continues to have a  DVT present.  The patient became very irate and at some points yelling.  The patient stated that he was "tired of wearing compression socks, tired of taking his Eliquis and tired of feeling tired and exhausted".  He is also concerned that he may develop a new pulmonary embolism from this chronic DVT.  The patient is upset and wanting medication that well ensure that the DVT will be completely resolved.  The patient is upset because he has had friends and family that have had DVTs that have resolved and he is upset because his still has not.  He feels that the DVT should be completely resolved at approximately a year out from his original diagnosis.  I referenced with the patient our previous discussions about the difference between acute and chronic DVT.  I reiterated with the patient that chronic DVTs are much more like concrete and they will not result in a pulmonary embolism.  I also discussed with the patient that there is generally not a medication that will guarantee quick resolution of a DVT.  We discussed the different anticoagulation options available to the patient.  Currently the patient is on Eliquis twice a day.  I discussed the patient that there are some studies suggest that Eliquis is not the best anticoagulation option for patients of large size and stature and I suggested transitioning to Coumadin as  this is titratable and we can also try to have a higher goal.  I also reiterated that even with Coumadin I could not guarantee that this would result in quick resolution of the DVT as it is something that is different for everyone.  The patient inquired about starting Xarelto and if this would work better than Eliquis or if it was stronger than Eliquis.  I also discussed the mechanism of novel oral anticoagulation and that the Xarelto may yield a similar result as the Eliquis.  The patient was upset at the idea of starting Coumadin and refused and stated that he wanted to try Xarelto instead.  I have sent prescriptions to his pharmacy to pick up to start Xarelto as well as to his mail order pharmacy as requested.  I also discussed with the patient that now that he has had a DVT even with resolution, he will continue to need to wear compression socks due to postphlebitic syndrome.  I also discussed with the patient his tiredness and that this chronic DVT would not cause him to have continued tiredness and likewise the chronic DVT would not cause his numbness.  In fact the patient does not have a vascular cause of this numbness and I suggested that he follow-up with his primary care provider to further work-up the cause of his numbness.  The patient inquired if he were to take Xarelto twice a day versus the recommended once today what would happen.  I highly discouraged that as he would be at a greater risk for bleeds.  The patient also inquired what would happen if he stopped taking anticoagulation altogether.  And I also discussed with him that now that the patient is 1 year out from his pulmonary embolism and DVT that stopping anticoagulation likely would not cause harm however it also would not cause quick resolution of his current DVT, which is what he currently wants.   The patient was unhappy and irate with all of this information.  I offered to refer the patient to a different vascular specialist if you  would like a second opinion or to a  hematologist oncologist and the patient refused both options.  I also discussed again with the patient about Xarelto and Coumadin and he again refused to try Coumadin.  Therefore, per the patient's request we will try Xarelto and reassess the patient in 6 months to see if there is a change in his DVT.  Addendum: Patient contacted the office one hour after his visit to let us know that he did not wish to go on xarelto and wished to continue with eliquis.  Patient still does not want coumadin.   - rivaroxaban (XARELTO) 20 MG TABS tablet; Take 1 tablet (20 mg total) by mouth daily with supper.  Dispense: 30 tablet; Refill: 0 - rivaroxaban (XARELTO) 20 MG TABS tablet; Take 1 tablet (20 mg total) by mouth daily with supper.  Dispense: 30 tablet; Refill: 5 - VAS [place] LOWER EXTREMITY VENOUS (DVT); Future   2. Essential hypertension Continue antihypertensive medications as already ordered, these medications have been reviewed and there are no changes at this time.   3. Primary osteoarthritis of left knee Continue NSAID medications as already ordered, these medications have been reviewed and there are no changes at this time.  Continued activity and therapy was stressed.     Current Outpatient Medications on File Prior to Visit  Medication Sig Dispense Refill  . apixaban (ELIQUIS) 5 MG TABS tablet Take 2 tabs twice a day for 5 days and then 1 tab twice a day starting 03/07/2018 60 tablet 12  . metoprolol succinate (TOPROL-XL) 100 MG 24 hr tablet Take 1 tablet (100 mg total) by mouth daily. 90 tablet 1  . sildenafil (VIAGRA) 100 MG tablet TK 1 T PO UTD AT LEAST 2 H PRIOR TO INTERCOURSE  2  . triamterene-hydrochlorothiazide (DYAZIDE) 37.5-25 MG capsule Take 1 each (1 capsule total) by mouth every morning. 90 capsule 1   No current facility-administered medications on file prior to visit.     There are no Patient Instructions on file for this visit. No  follow-ups on file.   Georgiana Spinner, NP  This note was completed with Office manager.  Any errors are purely unintentional.

## 2019-03-15 ENCOUNTER — Other Ambulatory Visit (INDEPENDENT_AMBULATORY_CARE_PROVIDER_SITE_OTHER): Payer: Self-pay | Admitting: Nurse Practitioner

## 2019-03-15 DIAGNOSIS — I82512 Chronic embolism and thrombosis of left femoral vein: Secondary | ICD-10-CM

## 2019-03-15 NOTE — Telephone Encounter (Signed)
Pharmacy has been informed to cancel Xarelto prescription

## 2019-04-04 ENCOUNTER — Encounter (INDEPENDENT_AMBULATORY_CARE_PROVIDER_SITE_OTHER): Payer: Self-pay

## 2019-04-09 ENCOUNTER — Encounter: Payer: Self-pay | Admitting: Family Medicine

## 2019-04-09 ENCOUNTER — Other Ambulatory Visit: Payer: Self-pay

## 2019-04-10 ENCOUNTER — Encounter: Payer: Self-pay | Admitting: Family Medicine

## 2019-04-10 ENCOUNTER — Ambulatory Visit: Payer: Managed Care, Other (non HMO) | Admitting: Family Medicine

## 2019-04-10 ENCOUNTER — Other Ambulatory Visit: Payer: Self-pay

## 2019-04-10 VITALS — BP 120/84 | HR 51 | Temp 97.0°F | Ht 79.0 in | Wt >= 6400 oz

## 2019-04-10 DIAGNOSIS — N3001 Acute cystitis with hematuria: Secondary | ICD-10-CM | POA: Diagnosis not present

## 2019-04-10 DIAGNOSIS — I82512 Chronic embolism and thrombosis of left femoral vein: Secondary | ICD-10-CM | POA: Insufficient documentation

## 2019-04-10 MED ORDER — SULFAMETHOXAZOLE-TRIMETHOPRIM 800-160 MG PO TABS: 1.0000 | ORAL_TABLET | Freq: Two times a day (BID) | ORAL | 0 refills | Status: AC

## 2019-04-10 NOTE — Patient Instructions (Signed)
Stay hydrated.  Let me know if urination issues are not improving with the new medication.  I will reach out to the pharmacy regarding dosing.   Let us know if you need anything.

## 2019-04-10 NOTE — Progress Notes (Signed)
Chief Complaint  Patient presents with  . Follow-up    blood clot  . Urinary Tract Infection    Hunter Hobbs is a 58 y.o. male here for possible UTI.  Duration: 5 days. Symptoms: Dysuria, urinary frequency and urgency Denies: hematuria, urinary retention, fever, nausea, vomiting and flank pain, discharge Hx of recurrent UTI? No Denies new sexual partners. He is not circumcised. Hx of UTI's. He was seen by urgent care over the weekend and prescribed Macrobid and Pyridium.  Neither were particularly helpful.  Hx of chronic L fem DVT. Follows w vasc team. Was on Eliquis for a year.  Because there is no improvement in his clot, he was changed to Xarelto.  He is compliant and is tolerating the medicine well so far.  ROS:  Constitutional: denies fever GU: As noted in HPI  Past Medical History:  Diagnosis Date  . Arthritis   . H/O blood clots   . Hypertension      BP 120/84 (BP Location: Left Arm, Patient Position: Sitting, Cuff Size: Large)   Pulse (!) 51   Temp (!) 97 F (36.1 C) (Temporal)   Ht 6\' 7"  (2.007 m)   Wt (!) 408 lb 8 oz (185.3 kg)   SpO2 95%   BMI 46.02 kg/m  General: Awake, alert, appears stated age Heart: RRR Lungs: CTAB, normal respiratory effort, no accessory muscle usage Abd: BS+, soft, NT, ND, no masses or organomegaly MSK: No CVA tenderness, neg Lloyd's sign Psych: Age appropriate judgment and insight  Chronic deep vein thrombosis (DVT) of femoral vein of left lower extremity (HCC)  Acute cystitis with hematuria  1- Cont Xarelto for now at 20 mg/d. I spoke with the pharmacy team regarding dosing issues due to his weight and there is no recommendation to increase based off of it.Marland Kitchen  2- Change Macrobid to Bactrim. OK to stop Azo. Stay hydrated. Seek immediate care if pt starts to develop fevers, new/worsening symptoms, uncontrollable N/V. F/u prn. The patient voiced understanding and agreement to the plan.  [place],  DO 04/10/19 12:07 PM

## 2019-04-11 ENCOUNTER — Other Ambulatory Visit (INDEPENDENT_AMBULATORY_CARE_PROVIDER_SITE_OTHER): Payer: Self-pay | Admitting: Nurse Practitioner

## 2019-04-13 ENCOUNTER — Encounter (INDEPENDENT_AMBULATORY_CARE_PROVIDER_SITE_OTHER): Payer: Self-pay

## 2019-04-14 ENCOUNTER — Other Ambulatory Visit (INDEPENDENT_AMBULATORY_CARE_PROVIDER_SITE_OTHER): Payer: Self-pay | Admitting: Nurse Practitioner

## 2019-04-16 NOTE — Telephone Encounter (Signed)
I spoken with the patient and he will has stop taking Eliquis and will continue with Xarelto since it is one time daily

## 2019-04-25 ENCOUNTER — Encounter (INDEPENDENT_AMBULATORY_CARE_PROVIDER_SITE_OTHER): Payer: Self-pay

## 2019-05-12 ENCOUNTER — Other Ambulatory Visit (INDEPENDENT_AMBULATORY_CARE_PROVIDER_SITE_OTHER): Payer: Self-pay | Admitting: Nurse Practitioner

## 2019-06-11 ENCOUNTER — Encounter (INDEPENDENT_AMBULATORY_CARE_PROVIDER_SITE_OTHER): Payer: Commercial Managed Care - PPO

## 2019-06-11 ENCOUNTER — Ambulatory Visit (INDEPENDENT_AMBULATORY_CARE_PROVIDER_SITE_OTHER): Payer: Commercial Managed Care - PPO | Admitting: Vascular Surgery

## 2019-06-20 ENCOUNTER — Encounter (INDEPENDENT_AMBULATORY_CARE_PROVIDER_SITE_OTHER): Payer: Self-pay

## 2019-07-20 ENCOUNTER — Encounter: Payer: Self-pay | Admitting: Family Medicine

## 2019-07-26 ENCOUNTER — Ambulatory Visit: Payer: Managed Care, Other (non HMO)

## 2019-07-30 ENCOUNTER — Other Ambulatory Visit (INDEPENDENT_AMBULATORY_CARE_PROVIDER_SITE_OTHER): Payer: Self-pay | Admitting: Nurse Practitioner

## 2019-08-06 ENCOUNTER — Encounter: Payer: Self-pay | Admitting: Family Medicine

## 2019-08-08 ENCOUNTER — Other Ambulatory Visit: Payer: Self-pay | Admitting: Family Medicine

## 2019-08-08 DIAGNOSIS — I10 Essential (primary) hypertension: Secondary | ICD-10-CM

## 2019-08-09 ENCOUNTER — Other Ambulatory Visit: Payer: Self-pay

## 2019-08-10 ENCOUNTER — Encounter: Payer: Managed Care, Other (non HMO) | Admitting: Family Medicine

## 2019-08-13 ENCOUNTER — Ambulatory Visit: Payer: Managed Care, Other (non HMO) | Admitting: Family Medicine

## 2019-08-13 ENCOUNTER — Encounter: Payer: Self-pay | Admitting: Family Medicine

## 2019-08-13 ENCOUNTER — Other Ambulatory Visit: Payer: Self-pay

## 2019-08-13 VITALS — BP 130/90 | HR 88 | Temp 96.4°F | Ht 79.0 in | Wt >= 6400 oz

## 2019-08-13 DIAGNOSIS — R071 Chest pain on breathing: Secondary | ICD-10-CM

## 2019-08-13 NOTE — Progress Notes (Signed)
Chief Complaint  Patient presents with  . Chest Pain    Hunter Hobbs is a 59 y.o. male here for evaluation of posterior L chest pain.  Duration of issue: 2 weeks Quality: sharp and continuous Palliation: certain positions, staying still Provocation: certain movements, heating pad This is not exertional.  Severity: 8/10 Radiation: None Duration of chest pain: Constant Associated symptoms: none- denies rash, swelling, fevers, sob, chest pressure, palpitations Cardiac history: Htn, Hx of DVT He has been compliant with his anticoagulation/Xarelto.  Family heart history: Htn Smoker? No  Past Medical History:  Diagnosis Date  . Arthritis   . H/O blood clots   . Hypertension    Family History  Problem Relation Age of Onset  . Hypertension Mother   . Hypertension Sister     Allergies as of 08/13/2019      Reactions   Iodinated Diagnostic Agents Anaphylaxis   Amlodipine    dizziness      Medication List       Accurate as of August 13, 2019  6:26 PM. If you have any questions, ask your nurse or doctor.        metoprolol succinate 100 MG 24 hr tablet Commonly known as: TOPROL-XL TAKE 1 TABLET BY MOUTH  DAILY   sildenafil 100 MG tablet Commonly known as: VIAGRA TK 1 T PO UTD AT LEAST 2 H PRIOR TO INTERCOURSE   triamterene-hydrochlorothiazide 37.5-25 MG capsule Commonly known as: DYAZIDE TAKE 1 CAPSULE BY MOUTH IN  THE MORNING   Xarelto 20 MG Tabs tablet Generic drug: rivaroxaban TAKE 1 TABLET BY MOUTH  DAILY WITH SUPPER       BP 130/90 (BP Location: Left Arm, Patient Position: Sitting, Cuff Size: Large)   Pulse 88   Temp (!) 96.4 F (35.8 C) (Temporal)   Ht 6\' 7"  (2.007 m)   Wt (!) 423 lb 2 oz (191.9 kg)   SpO2 97%   BMI 47.67 kg/m  Gen: awake, alert, appears stated age HEENT: PERRLA, MMM Neck: No masses or asymmetry Heart: RRR, no bruits, no LE edema Lungs: CTAB, no accessory muscle use MSK: chest pain is reproducible to palptation over the  posterior ax line at approx rib 8-10 Skin: No rashes Psych: Age appropriate judgment and insight, nml mood and affect  Chest pain on breathing - Plan: EKG 12-Lead  EKG shows NSR, nml axis, no interval abn, no T wave changes or ST seg depression/elevation. No sinus tach or signs of R heart strain. W reproducible chest pain, this is highly likely to be related to costochondritis rather than cardiovascular disease or pulmonary disease, including DVT. Particularly when taking his daily Xarelto into account.  F/u prn. The patient voiced understanding and agreement to the plan.  [place], DO 08/13/19 6:26 PM

## 2019-08-13 NOTE — Patient Instructions (Signed)
This is highly unlikely to be related to your heart or lungs.  Shortness of breath or chest pressure brought on by exertion is concerning.   Stay on the Xarelto.   Your EKG is reassuring.   Let us know if you need anything.

## 2019-08-14 ENCOUNTER — Encounter: Payer: Self-pay | Admitting: Family Medicine

## 2019-08-14 ENCOUNTER — Ambulatory Visit (INDEPENDENT_AMBULATORY_CARE_PROVIDER_SITE_OTHER): Payer: Managed Care, Other (non HMO) | Admitting: Family Medicine

## 2019-08-14 VITALS — BP 128/88 | HR 79 | Temp 96.6°F | Ht 79.0 in | Wt >= 6400 oz

## 2019-08-14 DIAGNOSIS — Z0000 Encounter for general adult medical examination without abnormal findings: Secondary | ICD-10-CM

## 2019-08-14 DIAGNOSIS — Z1159 Encounter for screening for other viral diseases: Secondary | ICD-10-CM | POA: Diagnosis not present

## 2019-08-14 DIAGNOSIS — Z125 Encounter for screening for malignant neoplasm of prostate: Secondary | ICD-10-CM | POA: Diagnosis not present

## 2019-08-14 MED ORDER — RIVAROXABAN 20 MG PO TABS: ORAL_TABLET | ORAL | 3 refills | Status: DC

## 2019-08-14 NOTE — Patient Instructions (Addendum)
Give [place] 2-3 business days to get the results of your labs back.   Keep the diet clean and stay active.  Aim to do some physical exertion for 150 minutes per week. This is typically divided into 5 days per week, 30 minutes per day. The activity should be enough to get your heart rate up. Anything is better than nothing if you have time constraints. Start some weight resistance exercise. Consider yoga.    Let us know if you need anything.

## 2019-08-14 NOTE — Progress Notes (Signed)
Chief Complaint  Patient presents with  . Annual Exam    Well Male Hunter Hobbs is here for a complete physical.   His last physical was >1 year ago.  Current diet: in general, diet could be better.  Current exercise: walking Weight trend: stable Daytime fatigue? No. Seat belt? Yes.    Health maintenance Shingrix- No Colonoscopy- Yes Tetanus- Yes HIV- Yes Hep C- No Prostate cancer screening- No   Past Medical History:  Diagnosis Date  . Arthritis   . H/O blood clots   . Hypertension       Past Surgical History:  Procedure Laterality Date  . KNEE SURGERY    . PERIPHERAL VASCULAR THROMBECTOMY Left 03/01/2018   Procedure: PERIPHERAL VASCULAR THROMBECTOMY;  Surgeon: Katha Cabal, MD;  Location: [facility];  Service: Cardiovascular;  Laterality: Left;    Medications  Current Outpatient Medications on File Prior to Visit  Medication Sig Dispense Refill  . metoprolol succinate (TOPROL-XL) 100 MG 24 hr tablet TAKE 1 TABLET BY MOUTH  DAILY 90 tablet 3  . sildenafil (VIAGRA) 100 MG tablet TK 1 T PO UTD AT LEAST 2 H PRIOR TO INTERCOURSE  2  . triamterene-hydrochlorothiazide (DYAZIDE) 37.5-25 MG capsule TAKE 1 CAPSULE BY MOUTH IN  THE MORNING 90 capsule 3   Allergies Allergies  Allergen Reactions  . Iodinated Diagnostic Agents Anaphylaxis  . Amlodipine     dizziness    Family History Family History  Problem Relation Age of Onset  . Hypertension Mother   . Hypertension Sister     Review of Systems: Constitutional:  no fevers Eye:  no recent significant change in vision Ear/Nose/Mouth/Throat:  Ears:  no hearing loss Nose/Mouth/Throat:  no complaints of nasal congestion, no sore throat Cardiovascular:  no chest pain, no palpitations Respiratory:  no cough and no shortness of breath Gastrointestinal:  no abdominal pain, no change in bowel habits GU:  Male: negative for dysuria, frequency, and incontinence and negative for prostate  symptoms Musculoskeletal/Extremities:  no pain, redness, or swelling of the joints Integumentary (Skin/Breast):  no abnormal skin lesions reported Neurologic:  no headaches Endocrine: No unexpected weight changes Hematologic/Lymphatic:  no abnormal bleeding  Exam BP 128/88 (BP Location: Left Arm, Patient Position: Sitting, Cuff Size: Large)   Pulse 79   Temp (!) 96.6 F (35.9 C) (Temporal)   Ht 6\' 7"  (2.007 m)   Wt (!) 421 lb 4 oz (191.1 kg)   SpO2 98%   BMI 47.46 kg/m  General:  well developed, well nourished, in no apparent distress Skin:  no significant moles, warts, or growths Head:  no masses, lesions, or tenderness Eyes:  pupils equal and round, sclera anicteric without injection Ears:  canals without lesions, TMs shiny without retraction, no obvious effusion, no erythema Nose:  nares patent, septum midline, mucosa normal Throat/Pharynx:  lips and gingiva without lesion; tongue and uvula midline; non-inflamed pharynx; no exudates or postnasal drainage Neck: neck supple without adenopathy, thyromegaly, or masses Cardiac: RRR, no bruits, no LE edema Lungs:  clear to auscultation, breath sounds equal bilaterally, no respiratory distress Rectal: Deferred Musculoskeletal:  symmetrical muscle groups noted without atrophy or deformity Neuro:  gait normal; deep tendon reflexes normal and symmetric Psych: well oriented with normal range of affect and appropriate judgment/insight  Assessment and Plan  Well adult exam - Plan: CBC, Comprehensive metabolic panel, Lipid panel  Encounter for hepatitis C screening test for low risk patient - Plan: Hepatitis C antibody  Screening for malignant  neoplasm of prostate - Plan: PSA   Well 59 y.o. male. Counseled on diet and exercise.  Recommended going back to weight resistance exercise.  Consider yoga. Counseled on risks and benefits of prostate cancer screening with PSA. The patient agrees to undergo testing. Immunizations, labs, and  further orders as above. Follow up 6 months. The patient voiced understanding and agreement to the plan.  Jilda Roche [place], DO 08/14/19 4:16 PM

## 2019-08-15 ENCOUNTER — Other Ambulatory Visit (INDEPENDENT_AMBULATORY_CARE_PROVIDER_SITE_OTHER): Payer: Managed Care, Other (non HMO)

## 2019-08-15 DIAGNOSIS — R739 Hyperglycemia, unspecified: Secondary | ICD-10-CM | POA: Diagnosis not present

## 2019-08-15 LAB — CBC
HCT: 43.6 % (ref 39.0–52.0)
Hemoglobin: 15 g/dL (ref 13.0–17.0)
MCHC: 34.3 g/dL (ref 30.0–36.0)
MCV: 86.4 fl (ref 78.0–100.0)
Platelets: 208 10*3/uL (ref 150.0–400.0)
RBC: 5.05 Mil/uL (ref 4.22–5.81)
RDW: 14.1 % (ref 11.5–15.5)
WBC: 8.4 10*3/uL (ref 4.0–10.5)

## 2019-08-15 LAB — COMPREHENSIVE METABOLIC PANEL
ALT: 22 U/L (ref 0–53)
AST: 14 U/L (ref 0–37)
Albumin: 4.2 g/dL (ref 3.5–5.2)
Alkaline Phosphatase: 61 U/L (ref 39–117)
BUN: 14 mg/dL (ref 6–23)
CO2: 24 mEq/L (ref 19–32)
Calcium: 9.3 mg/dL (ref 8.4–10.5)
Chloride: 104 mEq/L (ref 96–112)
Creatinine, Ser: 1.07 mg/dL (ref 0.40–1.50)
GFR: 85.98 mL/min (ref >60.00)
Glucose, Bld: 119 mg/dL — ABNORMAL HIGH (ref 70–99)
Potassium: 3.8 mEq/L (ref 3.5–5.1)
Sodium: 137 mEq/L (ref 135–145)
Total Bilirubin: 0.9 mg/dL (ref 0.2–1.2)
Total Protein: 6.9 g/dL (ref 6.0–8.3)

## 2019-08-15 LAB — LIPID PANEL
Cholesterol: 94 mg/dL (ref 0–200)
HDL: 43.9 mg/dL (ref >39.00)
LDL Cholesterol: 21 mg/dL (ref 0–99)
NonHDL: 50.02
Total CHOL/HDL Ratio: 2
Triglycerides: 147 mg/dL (ref 0.0–149.0)
VLDL: 29.4 mg/dL (ref 0.0–40.0)

## 2019-08-15 LAB — HEPATITIS C ANTIBODY
Hepatitis C Ab: NONREACTIVE
SIGNAL TO CUT-OFF: 0.04 (ref <1.00)

## 2019-08-15 LAB — PSA: PSA: 2.6 ng/mL (ref 0.10–4.00)

## 2019-08-15 LAB — HEMOGLOBIN A1C: Hgb A1c MFr Bld: 6.8 % — ABNORMAL HIGH (ref 4.6–6.5)

## 2019-08-16 ENCOUNTER — Ambulatory Visit: Payer: Managed Care, Other (non HMO) | Admitting: Family Medicine

## 2019-08-21 ENCOUNTER — Other Ambulatory Visit: Payer: Self-pay

## 2019-08-22 ENCOUNTER — Encounter: Payer: Self-pay | Admitting: Family Medicine

## 2019-08-22 ENCOUNTER — Ambulatory Visit: Payer: Managed Care, Other (non HMO) | Admitting: Family Medicine

## 2019-08-22 ENCOUNTER — Other Ambulatory Visit: Payer: Self-pay

## 2019-08-22 VITALS — BP 138/78 | HR 91 | Temp 95.0°F | Ht 79.0 in | Wt >= 6400 oz

## 2019-08-22 DIAGNOSIS — Z23 Encounter for immunization: Secondary | ICD-10-CM | POA: Diagnosis not present

## 2019-08-22 DIAGNOSIS — E1165 Type 2 diabetes mellitus with hyperglycemia: Secondary | ICD-10-CM

## 2019-08-22 MED ORDER — ROSUVASTATIN CALCIUM 20 MG PO TABS: 20.0000 mg | ORAL_TABLET | Freq: Every day | ORAL | 3 refills | Status: DC

## 2019-08-22 NOTE — Progress Notes (Signed)
Chief Complaint  Patient presents with  . Follow-up    lab results    Subjective: Patient is a 58 y.o. male here for lab f/u.  Patiently recent a physical that showed an A1c of 6.8.  He has already lost 5 pounds as he is improving his diet.  He did not see an eye doctor routinely.  He has never had a pneumonia shot that he is aware of.  He is not on a statin.  He is going to start exercising more routinely.  He has never had a microalbumin creatinine ratio.  Past Medical History:  Diagnosis Date  . Arthritis   . H/O blood clots   . Hypertension     Objective: BP 138/78 (BP Location: Left Arm, Patient Position: Sitting, Cuff Size: Large)   Pulse 91   Temp (!) 95 F (35 C) (Temporal)   Ht 6\' 7"  (2.007 m)   Wt (!) 418 lb 8 oz (189.8 kg)   SpO2 96%   BMI 47.15 kg/m  General: Awake, appears stated age Neuro: Sensation of both feet intact to pinprick Skin: Some scaly/dry skin on the feet but otherwise no callus formation or sores/lesions noted Lungs: No accessory muscle use Psych: Age appropriate judgment and insight, normal affect and mood  Assessment and Plan: Type 2 diabetes mellitus with hyperglycemia, without long-term current use of insulin (HCC) - Plan: Ambulatory referral to Ophthalmology, Microalbumin / creatinine urine ratio, Hepatic function panel, Lipid panel, rosuvastatin (CRESTOR) 20 MG tablet  Need for vaccination against Streptococcus pneumoniae - Plan: Pneumococcal polysaccharide vaccine 23-valent greater than or equal to 2yo subcutaneous/IM  Morbid obesity (HCC)  We will check a microalbumin creatinine ratio, foot exam today, PCV23 today, start statin with recheck of hepatic function panel and lipid panel in 6 weeks, okay to hold off on checking sugars at home, no sugar lowering medications for now, counseled on diet and exercise, refer to ophthalmology. I will see him in 6 months unless he needs . The patient voiced understanding and agreement to the  plan.  [place], DO 08/22/19  8:11 PM

## 2019-08-22 NOTE — Patient Instructions (Signed)
If you do not hear anything about your referral in the next 1-2 weeks, call our office and ask for an update.  Keep the diet clean and stay active.  We are not even close to insulin.   OK to hold off on checking sugars for now.   Let us know if you need anything.

## 2019-08-23 LAB — MICROALBUMIN / CREATININE URINE RATIO
Creatinine,U: 221.2 mg/dL
Microalb Creat Ratio: 0.3 mg/g (ref 0.0–30.0)
Microalb, Ur: 0.7 mg/dL (ref 0.0–1.9)

## 2019-08-28 ENCOUNTER — Other Ambulatory Visit: Payer: Self-pay | Admitting: Family Medicine

## 2019-08-28 DIAGNOSIS — E1165 Type 2 diabetes mellitus with hyperglycemia: Secondary | ICD-10-CM

## 2019-08-28 MED ORDER — ROSUVASTATIN CALCIUM 20 MG PO TABS: 20.0000 mg | ORAL_TABLET | Freq: Every day | ORAL | 3 refills | Status: DC

## 2019-08-28 NOTE — Telephone Encounter (Signed)
Which medication is he referring to as far as his a1c is concerned is he supposed to be taking a medication for his a1c Please advise

## 2019-08-29 ENCOUNTER — Other Ambulatory Visit: Payer: Self-pay | Admitting: Family Medicine

## 2019-08-29 DIAGNOSIS — E1165 Type 2 diabetes mellitus with hyperglycemia: Secondary | ICD-10-CM

## 2019-08-29 MED ORDER — ROSUVASTATIN CALCIUM 20 MG PO TABS: 20.0000 mg | ORAL_TABLET | Freq: Every day | ORAL | 3 refills | Status: DC

## 2019-09-13 ENCOUNTER — Other Ambulatory Visit: Payer: Self-pay

## 2019-09-13 ENCOUNTER — Encounter (INDEPENDENT_AMBULATORY_CARE_PROVIDER_SITE_OTHER): Payer: Self-pay | Admitting: Vascular Surgery

## 2019-09-13 ENCOUNTER — Ambulatory Visit (INDEPENDENT_AMBULATORY_CARE_PROVIDER_SITE_OTHER): Payer: Managed Care, Other (non HMO) | Admitting: Vascular Surgery

## 2019-09-13 ENCOUNTER — Ambulatory Visit (INDEPENDENT_AMBULATORY_CARE_PROVIDER_SITE_OTHER): Payer: Managed Care, Other (non HMO)

## 2019-09-13 VITALS — BP 139/92 | HR 66 | Resp 16 | Wt >= 6400 oz

## 2019-09-13 DIAGNOSIS — E1165 Type 2 diabetes mellitus with hyperglycemia: Secondary | ICD-10-CM

## 2019-09-13 DIAGNOSIS — I82512 Chronic embolism and thrombosis of left femoral vein: Secondary | ICD-10-CM | POA: Diagnosis not present

## 2019-09-13 DIAGNOSIS — I2699 Other pulmonary embolism without acute cor pulmonale: Secondary | ICD-10-CM

## 2019-09-13 DIAGNOSIS — M1712 Unilateral primary osteoarthritis, left knee: Secondary | ICD-10-CM

## 2019-09-13 NOTE — Progress Notes (Signed)
MRN : [id number]  Jermy Couper is a 59 y.o. (Dec 22, 1960) male who presents with chief complaint of No chief complaint on file. Marland Kitchen  History of Present Illness:   The patient presents to the office for follow up evaluation of DVT.  DVT was identified at [facility] by Duplex ultrasound on 10/16.    At that time he underwent:   Percutaneous transluminal angioplasty of the superficial femoral vein to 10 mm with infusion thrombolysis with 16 mg of TPA and mechanical thrombectomy of the left popliteal, SFV and common femoral vein using the penumbra cat 6 catheter.    The initial symptoms were pain and swelling in the lower extremity.  As the inciting event he drove 16 hours with minimal stops last week and has been having pain in the leg since that time.  Over the past few days it has gotten worse, and he sought medical treatment.  No previous history of DVT, superficial thrombophlebitis, or other clotting issues.  No family history that he knows of.  The patient notes the leg continues to be very painful with dependency and swells when he wears compression socks knee-high but when he wears more of a full compression pants he has far fewer symptoms.  Symptoms are much better with elevation.  The patient notes minimal edema in the morning which steadily worsens throughout the day.    The patient has been using compression therapy at this point.  No SOB or pleuritic chest pains.  No cough or hemoptysis.  No blood per rectum or blood in any sputum.  No excessive bruising per the patient.       No outpatient medications have been marked as taking for the 09/13/19 encounter (Appointment) with Gilda Crease, Latina Craver, MD.    Past Medical History:  Diagnosis Date  . Arthritis   . H/O blood clots   . Hypertension     Past Surgical History:  Procedure Laterality Date  . KNEE SURGERY    . PERIPHERAL VASCULAR THROMBECTOMY Left 03/01/2018   Procedure: PERIPHERAL VASCULAR THROMBECTOMY;  Surgeon: Renford Dills, MD;  Location: [facility] INVASIVE CV LAB;  Service: Cardiovascular;  Laterality: Left;    Social History Social History   Tobacco Use  . Smoking status: Never Smoker  . Smokeless tobacco: Never Used  Substance Use Topics  . Alcohol use: Not Currently  . Drug use: Never    Family History Family History  Problem Relation Age of Onset  . Hypertension Mother   . Hypertension Sister     Allergies  Allergen Reactions  . Iodinated Diagnostic Agents Anaphylaxis  . Amlodipine     dizziness     REVIEW OF SYSTEMS (Negative unless checked)  Constitutional: [] Weight loss  [] Fever  [] Chills Cardiac: [] Chest pain   [] Chest pressure   [] Palpitations   [] Shortness of breath when laying flat   [] Shortness of breath with exertion. Vascular:  [] Pain in legs with walking   [x] Pain in legs at rest  [x] History of DVT   [] Phlebitis   [x] Swelling in legs   [] Varicose veins   [] Non-healing ulcers Pulmonary:   [] Uses home oxygen   [] Productive cough   [] Hemoptysis   [] Wheeze  [] COPD   [] Asthma Neurologic:  [] Dizziness   [] Seizures   [] History of stroke   [] History of TIA  [] Aphasia   [] Vissual changes   [] Weakness or numbness in arm   [] Weakness or numbness in leg Musculoskeletal:   [] Joint swelling   [x] Joint pain   [] Low  back pain Hematologic:  [] Easy bruising  [] Easy bleeding   [] Hypercoagulable state   [] Anemic Gastrointestinal:  [] Diarrhea   [] Vomiting  [] Gastroesophageal reflux/heartburn   [] Difficulty swallowing. Genitourinary:  [] Chronic kidney disease   [] Difficult urination  [] Frequent urination   [] Blood in urine Skin:  [] Rashes   [] Ulcers  Psychological:  [] History of anxiety   []  History of major depression.  Physical Examination  There were no vitals filed for this visit. There is no height or weight on file to calculate BMI. Gen: WD/WN, NAD Head: [place]/AT, No temporalis wasting.  Ear/Nose/Throat: Hearing grossly intact, nares w/o erythema or drainage Eyes: PER, EOMI, sclera  nonicteric.  Neck: Supple, no large masses.   Pulmonary:  Good air movement, no audible wheezing bilaterally, no use of accessory muscles.  Cardiac: RRR, no JVD Vascular: scattered varicosities present bilaterally.  Mild venous stasis changes to the legs bilaterally.  2-3+ soft pitting edema Vessel Right Left  Radial Palpable Palpable  PT Palpable Palpable  DP Palpable Palpable  Gastrointestinal: Non-distended. No guarding/no peritoneal signs.  Musculoskeletal: M/S 5/5 throughout.  No deformity or atrophy.  Neurologic: CN 2-12 intact. Symmetrical.  Speech is fluent. Motor exam as listed above. Psychiatric: Judgment intact, Mood & affect appropriate for pt's clinical situation. Dermatologic: No rashes or ulcers noted.  No changes consistent with cellulitis. Lymph : No lichenification or skin changes of chronic lymphedema.  CBC Lab Results  Component Value Date   WBC 8.4 08/14/2019   HGB 15.0 08/14/2019   HCT 43.6 08/14/2019   MCV 86.4 08/14/2019   PLT 208.0 08/14/2019    BMET    Component Value Date/Time   NA 137 08/14/2019 1609   K 3.8 08/14/2019 1609   CL 104 08/14/2019 1609   CO2 24 08/14/2019 1609   GLUCOSE 119 (H) 08/14/2019 1609   BUN 14 08/14/2019 1609   CREATININE 1.07 08/14/2019 1609   CALCIUM 9.3 08/14/2019 1609   GFRNONAA >60 09/30/2018 2120   GFRAA >60 09/30/2018 2120   CrCl cannot be calculated (Patient's most recent lab result is older than the maximum 21 days allowed.).  COAG Lab Results  Component Value Date   INR 1.14 02/26/2018    Radiology No results found.   Assessment/Plan 1. Chronic deep vein thrombosis (DVT) of femoral vein of left lower extremity (HCC) Recommend:   No surgery or intervention at this point in time.  IVC filter is not indicated at present.  Patient's duplex ultrasound of the venous system shows chronic changes from the popliteal to the superficial femoral veins.  The patient is tolerating on anticoagulation    Elevation was stressed, use of a recliner was discussed.  I have had a long discussion with the patient regarding DVT and post phlebitic changes such as swelling and why it  causes symptoms such as pain.  The patient will continue to wear graduated compression stockings class 1 (20-30 mmHg), beginning after three full days of anticoagulation, on a daily basis a prescription was given. The patient will  beginning wearing the stockings first thing in the morning and removing them in the evening. The patient is instructed specifically not to sleep in the stockings.  In addition, behavioral modification including elevation during the day and avoidance of prolonged dependency will be initiated.    The patient will continue anticoagulation for now as there have not been any problems or complications at this point.    2. Acute pulmonary embolism, unspecified pulmonary embolism type, unspecified whether acute cor pulmonale present (  HCC) See #1  We will continue full dose anticoagulation for one full year and then decide whether long term low dose would be helpful  3. Type 2 diabetes mellitus with hyperglycemia, without long-term current use of insulin (HCC) Continue hypoglycemic medications as already ordered, these medications have been reviewed and there are no changes at this time.  Hgb A1C to be monitored as already arranged by primary service   4. Primary osteoarthritis of left knee Continue NSAID medications as already ordered, these medications have been reviewed and there are no changes at this time.  Continued activity and therapy was stressed.     Levora Dredge, MD  09/13/2019 8:03 AM

## 2019-09-14 ENCOUNTER — Encounter (INDEPENDENT_AMBULATORY_CARE_PROVIDER_SITE_OTHER): Payer: Self-pay | Admitting: Vascular Surgery

## 2019-09-20 IMAGING — NM NM PULMONARY VENT & PERF
2 series · 16 of 16 positions shown · non-contrast
Comparison: Chest x-ray 02/26/2018, 02/27/2018

CLINICAL DATA: 57-year-old male with shortness of breath and a
history of left leg DVT

EXAM:
NUCLEAR MEDICINE VENTILATION - PERFUSION LUNG SCAN
TECHNIQUE: Ventilation images were obtained in multiple projections using
inhaled aerosol Yc-CCm DTPA. Perfusion images were obtained in
multiple projections after intravenous injection of [id number].
RADIOPHARMACEUTICALS:  41.7 mCi of Yc-CCm DTPA aerosol inhalation
and 4.9 mCi [id number] IV

[Series 1000: lung ventilation · 3.90mm/px · 4 acquisitions, 8 frames shown]
[im 1/4]
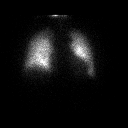
[im 1/4]
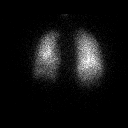
[im 2/4]
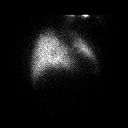
[im 2/4]
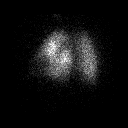
[im 3/4]
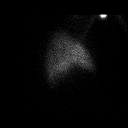
[im 3/4]
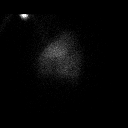
[im 4/4]
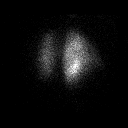
[im 4/4]
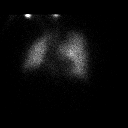

[Series 1000: lung perfusion · 1.95mm/px · 4 acquisitions, 8 frames shown]
[im 1/4]
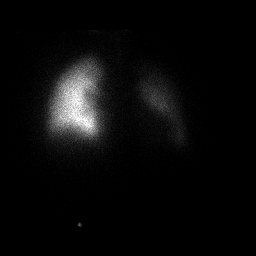
[im 1/4]
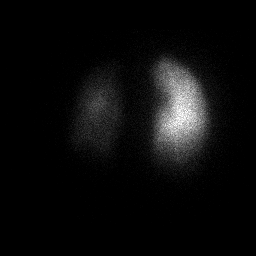
[im 2/4]
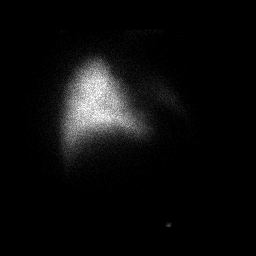
[im 2/4]
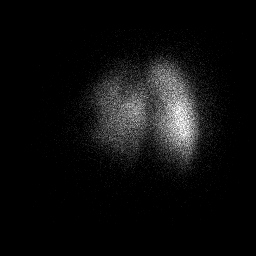
[im 3/4]
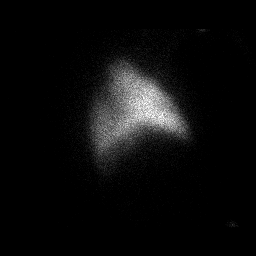
[im 3/4]
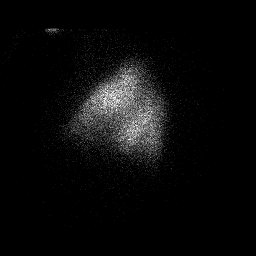
[im 4/4]
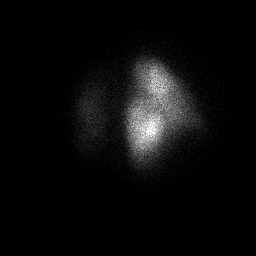
[im 4/4]
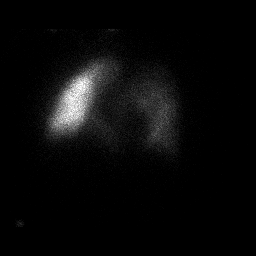

[16 of 16 positions shown; findings below may reference images not displayed]

FINDINGS: Ventilation: No focal ventilation defect.

Perfusion: Decreased perfusion of the left lung on anterior
posterior and oblique perfusion planar imaging.
IMPRESSION: High probability ventilation perfusion study involving the left lung

## 2019-09-20 IMAGING — DX DG CHEST 1V PORT
1 series · 1 of 1 positions shown · non-contrast
Comparison: Yesterday

CLINICAL DATA: Shortness of breath

EXAM:
PORTABLE CHEST 1 VIEW

[chest ap]
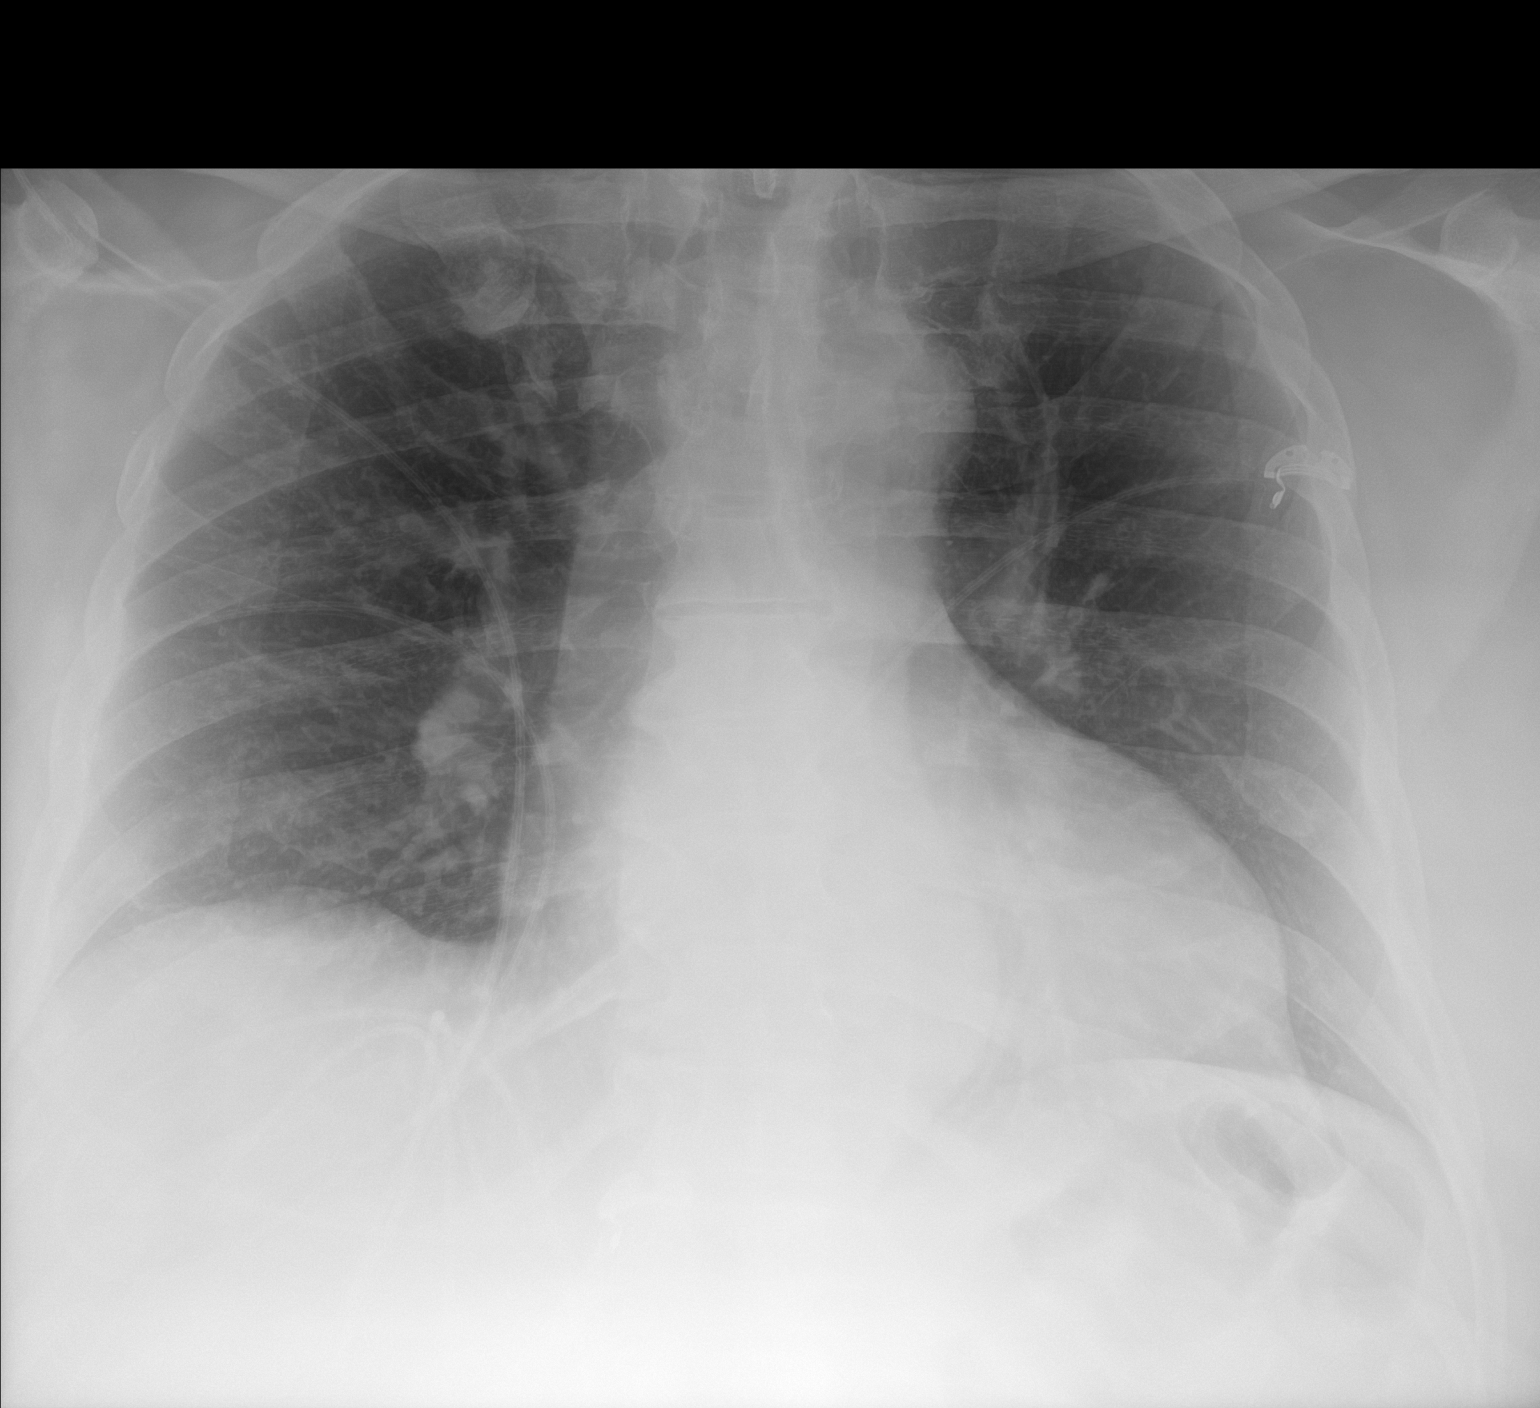

[1 of 1 positions shown; findings below may reference images not displayed]

FINDINGS: Prominent heart size accentuated by portable technique. Stable
aortic tortuosity. Low volumes with interstitial crowding. There is
no edema, consolidation, effusion, or pneumothorax.
IMPRESSION: Stable from yesterday.  No evidence of active disease.

## 2019-09-29 ENCOUNTER — Encounter (INDEPENDENT_AMBULATORY_CARE_PROVIDER_SITE_OTHER): Payer: Self-pay

## 2019-10-01 NOTE — Telephone Encounter (Signed)
Patient wants a definitive answer to "virtually unchanged". Please advise.

## 2019-10-06 IMAGING — CR DG CHEST 2V
2 series · 2 of 2 positions shown · non-contrast
Comparison: 02/27/2018

CLINICAL DATA: Recent hospital stay for DVT/PE discharged on
Eliquis. Dizziness and unsteady gait.

EXAM:
CHEST - 2 VIEW

[chest pa]
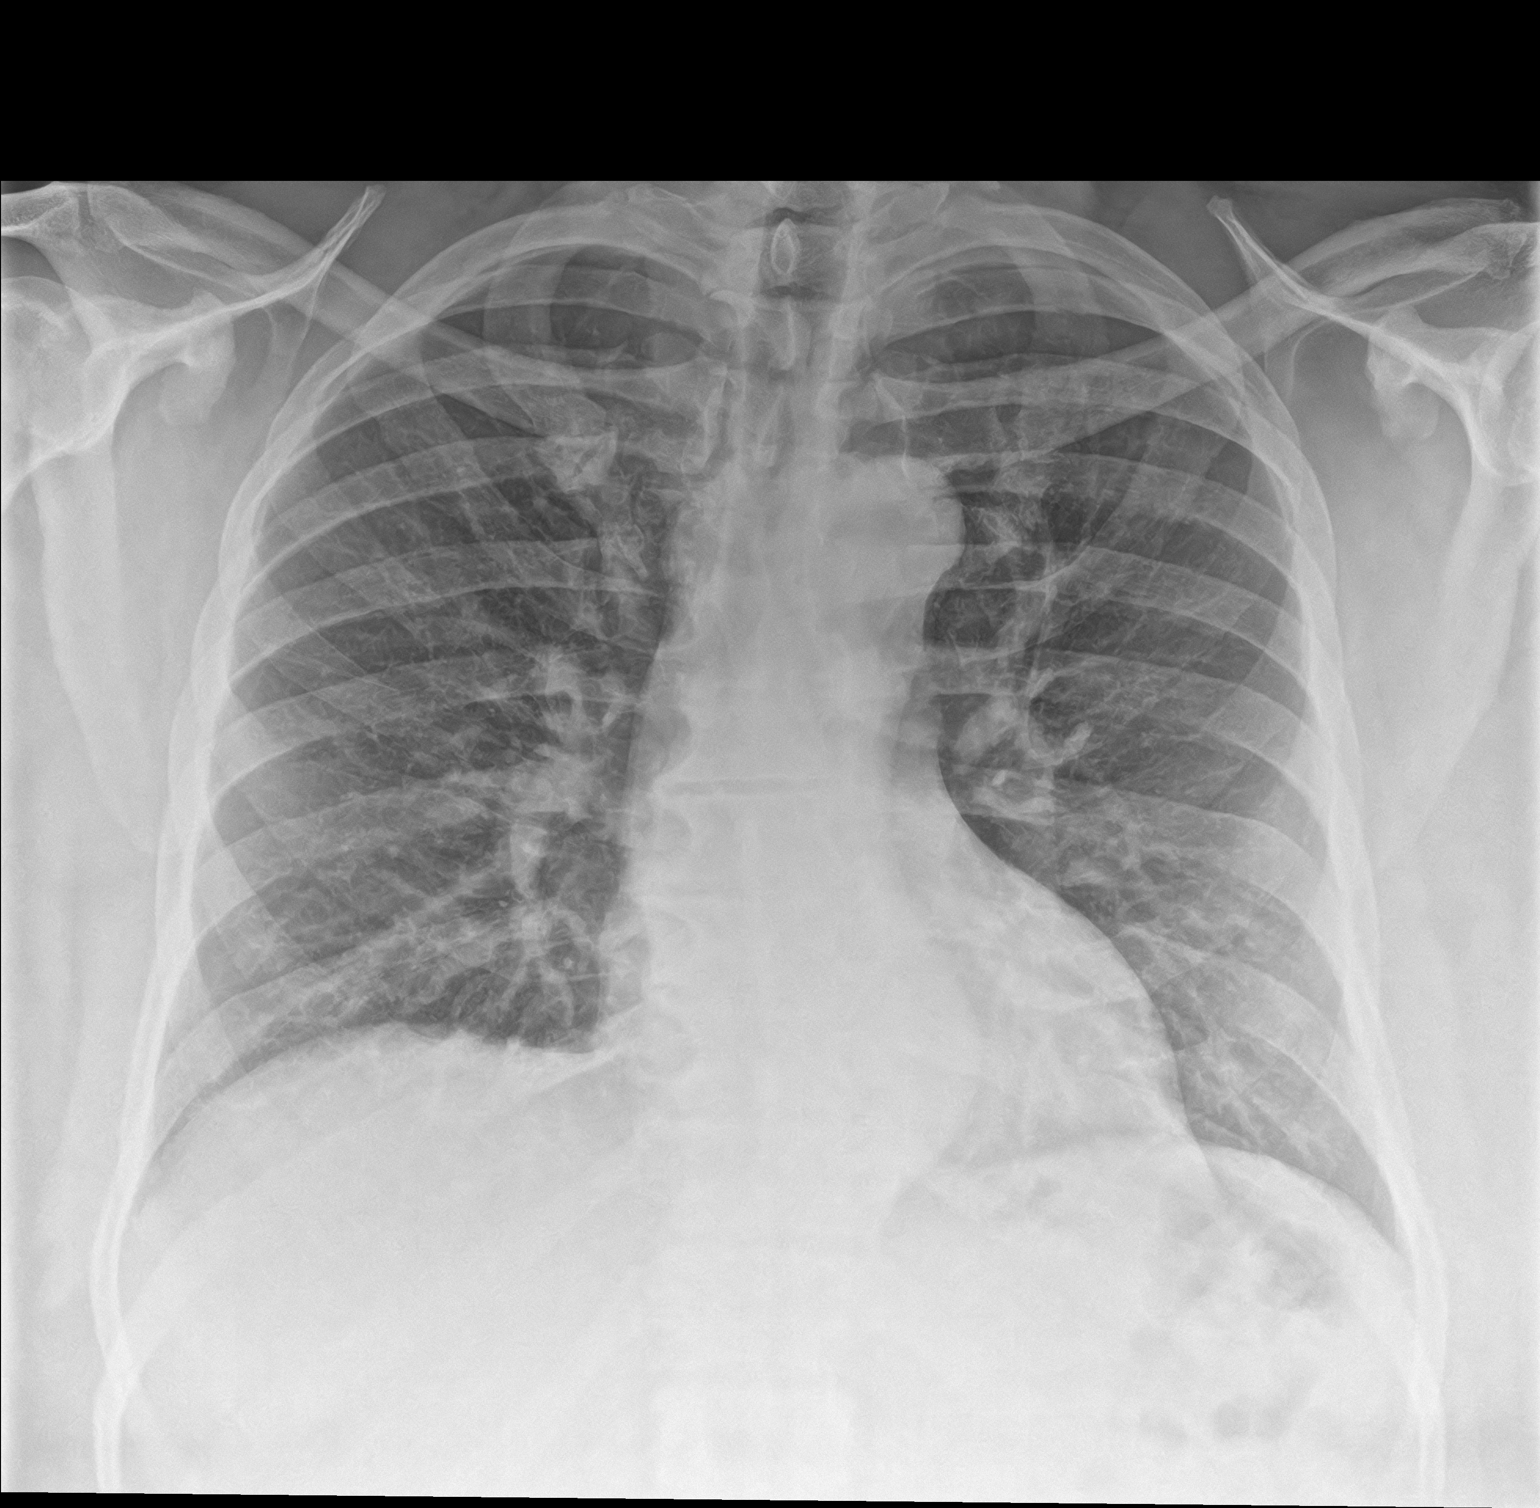

[chest lat]
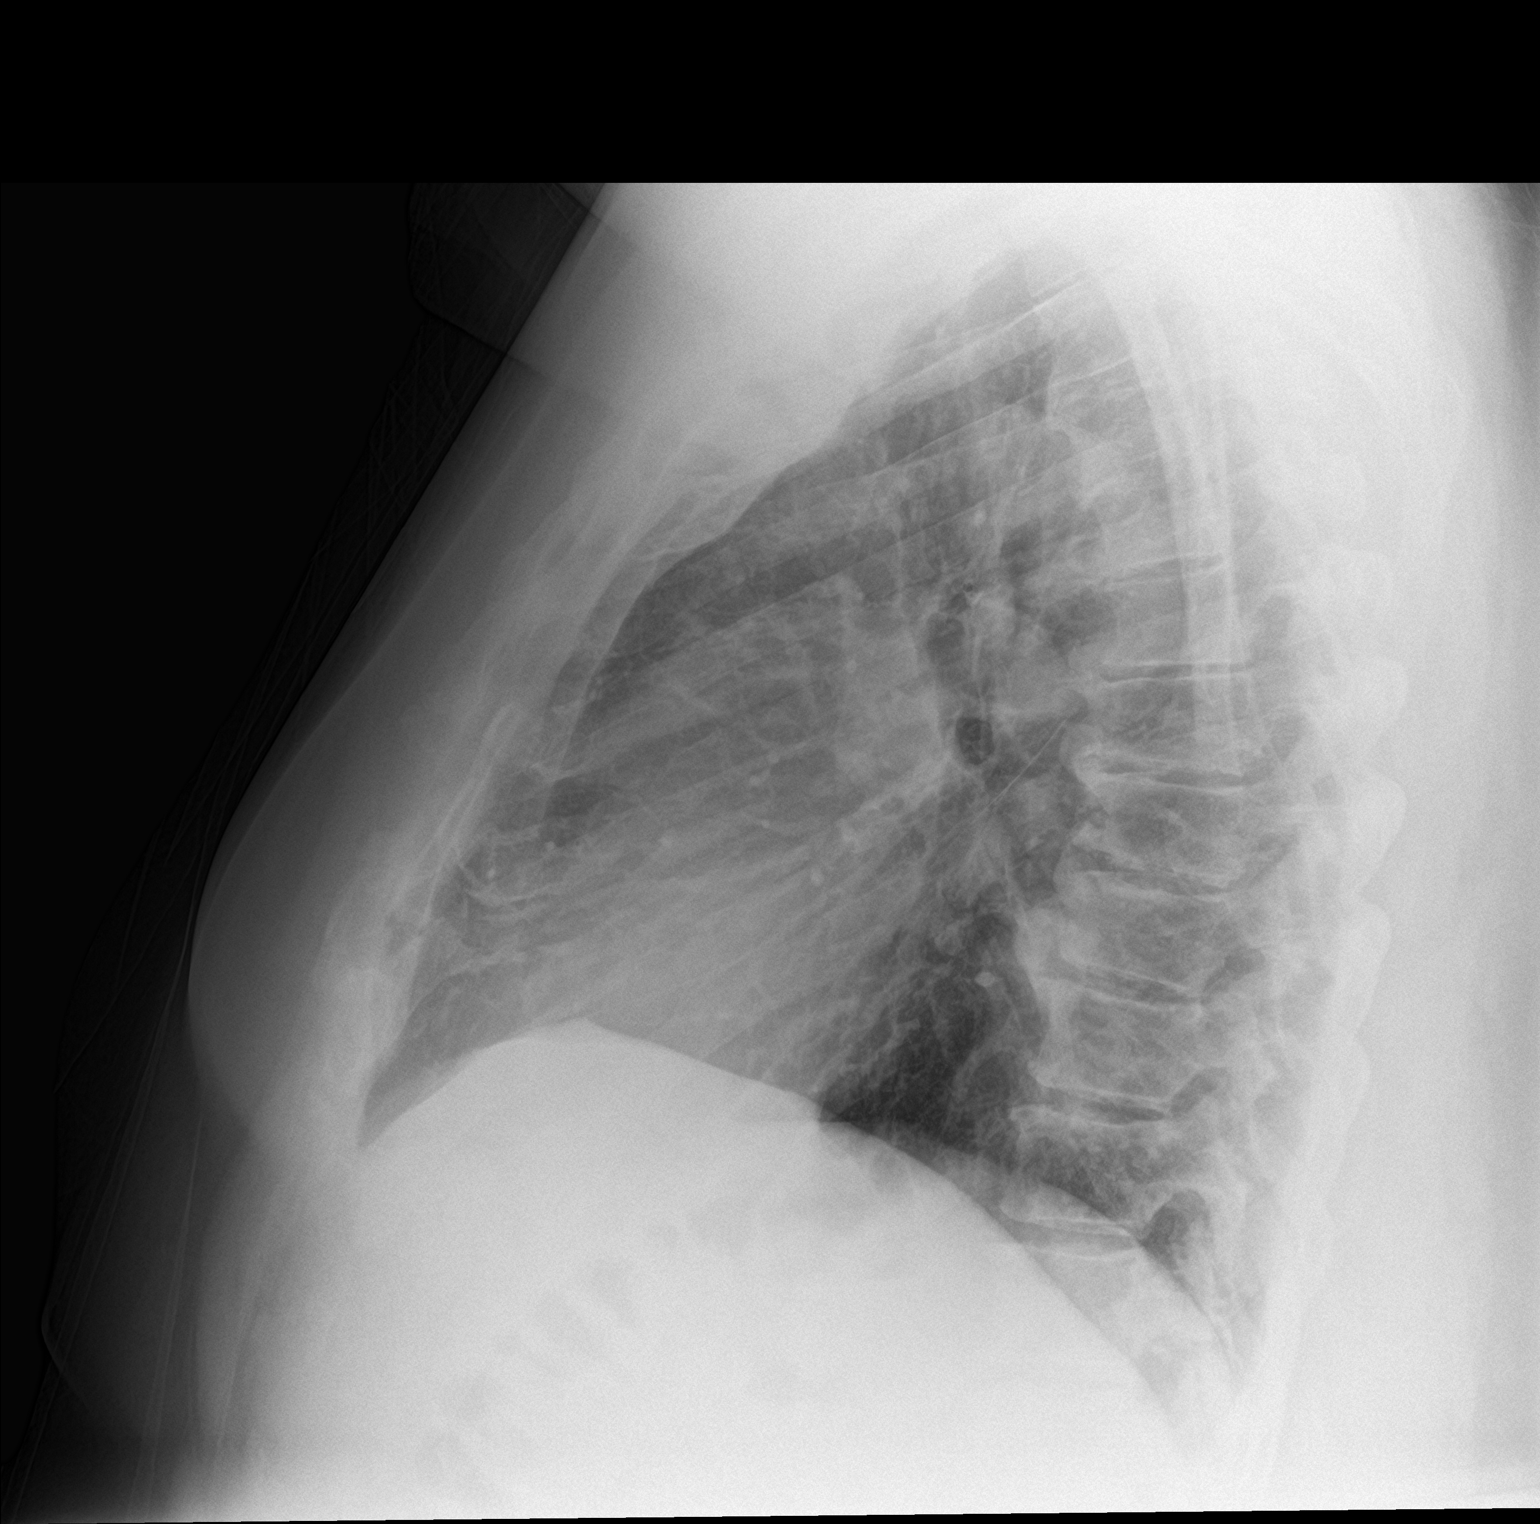

[2 of 2 positions shown; findings below may reference images not displayed]

FINDINGS: Lungs are adequately inflated without focal airspace consolidation
or effusion. Cardiomediastinal silhouette is within normal. There is
mild prominence of the perihilar markings unchanged. Degenerative
change of the spine.
IMPRESSION: Possible mild vascular congestion.

## 2019-10-16 ENCOUNTER — Ambulatory Visit: Payer: Managed Care, Other (non HMO) | Attending: Internal Medicine

## 2019-10-16 DIAGNOSIS — Z23 Encounter for immunization: Secondary | ICD-10-CM

## 2019-10-16 NOTE — Progress Notes (Signed)
   Covid-19 Vaccination Clinic  Name:  Hunter Hobbs    MRN: [id number] DOB: [birth year]  10/16/2019  [name] was observed post Covid-19 immunization for 15 minutes without incident. He was provided with Vaccine Information Sheet and instruction to access the V-Safe system.   [name] was instructed to call 911 with any severe reactions post vaccine: Marland Kitchen Difficulty breathing  . Swelling of face and throat  . A fast heartbeat  . A bad rash all over body  . Dizziness and weakness   Immunizations Administered    Name Date Dose VIS Date Route   Pfizer COVID-19 Vaccine 10/16/2019  8:22 AM 0.3 mL 07/11/2018 Intramuscular   Manufacturer: [employer], [employer]   Lot: [id number]   NDC: [address]-5

## 2019-11-23 ENCOUNTER — Encounter (INDEPENDENT_AMBULATORY_CARE_PROVIDER_SITE_OTHER): Payer: Self-pay

## 2019-12-21 ENCOUNTER — Ambulatory Visit: Payer: Managed Care, Other (non HMO) | Admitting: Family Medicine

## 2019-12-21 ENCOUNTER — Emergency Department
Admission: EM | Admit: 2019-12-21 | Discharge: 2019-12-21 | Disposition: A | Discharge status: Other Acute Inpatient Hospital | Payer: Managed Care, Other (non HMO)

## 2019-12-24 ENCOUNTER — Ambulatory Visit: Payer: Managed Care, Other (non HMO) | Admitting: Family Medicine

## 2020-03-12 NOTE — Progress Notes (Signed)
MRN : [id number]  Hunter Hobbs is a 59 y.o. (1960/11/02) male who presents with chief complaint of No chief complaint on file. Marland Kitchen  History of Present Illness:   The patient presents to the office for follow up evaluation of DVT.  DVT was identified at [facility] by Duplex ultrasound on 10/16.    At that time he underwent:   Percutaneous transluminal angioplasty of the superficial femoral vein to 10 mm with infusion thrombolysis with 16 mg of TPA and mechanical thrombectomy of the leftpopliteal, SFV and common femoral vein using the penumbra cat 6catheter.    The initial symptoms were pain and swelling in the lower extremity.  As the inciting event he drove 16 hours with minimal stops last week and has been having pain in the leg since that time. Over the past few days it has gotten worse, and he sought medical treatment. No previous history of DVT, superficial thrombophlebitis, or other clotting issues. No family history that he knows of.  The patient notes the leg continues to be very painful with dependency and swells when he wears compression socks knee-high but when he wears more of a full compression pants he has far fewer symptoms.  Symptoms are much better with elevation.  The patient notes minimal edema in the morning which steadily worsens throughout the day.    The patient has been using compression therapy at this point.  No SOB or pleuritic chest pains.  No cough or hemoptysis.  No blood per rectum or blood in any sputum.  No excessive bruising per the patient.  No outpatient medications have been marked as taking for the 03/13/20 encounter (Appointment) with Gilda Crease, Latina Craver, MD.    Past Medical History:  Diagnosis Date   Arthritis    H/O blood clots    Hypertension     Past Surgical History:  Procedure Laterality Date   KNEE SURGERY     PERIPHERAL VASCULAR THROMBECTOMY Left 03/01/2018   Procedure: PERIPHERAL VASCULAR THROMBECTOMY;  Surgeon: Renford Dills, MD;  Location: [facility] INVASIVE CV LAB;  Service: Cardiovascular;  Laterality: Left;    Social History Social History   Tobacco Use   Smoking status: Never Smoker   Smokeless tobacco: Never Used  Substance Use Topics   Alcohol use: Not Currently   Drug use: Never    Family History Family History  Problem Relation Age of Onset   Hypertension Mother    Hypertension Sister     Allergies  Allergen Reactions   Iodinated Diagnostic Agents Anaphylaxis   Amlodipine     dizziness     REVIEW OF SYSTEMS (Negative unless checked)  Constitutional: [] Weight loss  [] Fever  [] Chills Cardiac: [] Chest pain   [] Chest pressure   [] Palpitations   [] Shortness of breath when laying flat   [] Shortness of breath with exertion. Vascular:  [] Pain in legs with walking   [] Pain in legs at rest  [] History of DVT   [] Phlebitis   [x] Swelling in legs   [] Varicose veins   [] Non-healing ulcers Pulmonary:   [] Uses home oxygen   [] Productive cough   [] Hemoptysis   [] Wheeze  [] COPD   [] Asthma Neurologic:  [] Dizziness   [] Seizures   [] History of stroke   [] History of TIA  [] Aphasia   [] Vissual changes   [] Weakness or numbness in arm   [] Weakness or numbness in leg Musculoskeletal:   [] Joint swelling   [x] Joint pain   [] Low back pain Hematologic:  [] Easy bruising  [] Easy bleeding   []   Hypercoagulable state   [] Anemic Gastrointestinal:  [] Diarrhea   [] Vomiting  [] Gastroesophageal reflux/heartburn   [] Difficulty swallowing. Genitourinary:  [] Chronic kidney disease   [] Difficult urination  [] Frequent urination   [] Blood in urine Skin:  [] Rashes   [] Ulcers  Psychological:  [] History of anxiety   []  History of major depression.  Physical Examination  There were no vitals filed for this visit. There is no height or weight on file to calculate BMI. Gen: WD/WN, NAD Head: [place]/AT, No temporalis wasting.  Ear/Nose/Throat: Hearing grossly intact, nares w/o erythema or drainage Eyes: PER, EOMI, sclera  nonicteric.  Neck: Supple, no large masses.   Pulmonary:  Good air movement, no audible wheezing bilaterally, no use of accessory muscles.  Cardiac: RRR, no JVD Vascular:scattered varicosities present bilaterally.  Wearing compression.  1+ soft pitting edema Vessel Right Left  Radial Palpable Palpable  Gastrointestinal: Non-distended. No guarding/no peritoneal signs.  Musculoskeletal: M/S 5/5 throughout.  No deformity or atrophy.  Neurologic: CN 2-12 intact. Symmetrical.  Speech is fluent. Motor exam as listed above. Psychiatric: Judgment intact, Mood & affect appropriate for pt's clinical situation. Dermatologic: Venous rashes no ulcers noted.  No changes consistent with cellulitis. Lymph : No lichenification skin changes of chronic lymphedema.  CBC Lab Results  Component Value Date   WBC 8.4 08/14/2019   HGB 15.0 08/14/2019   HCT 43.6 08/14/2019   MCV 86.4 08/14/2019   PLT 208.0 08/14/2019    BMET    Component Value Date/Time   NA 137 08/14/2019 1609   K 3.8 08/14/2019 1609   CL 104 08/14/2019 1609   CO2 24 08/14/2019 1609   GLUCOSE 119 (H) 08/14/2019 1609   BUN 14 08/14/2019 1609   CREATININE 1.07 08/14/2019 1609   CALCIUM 9.3 08/14/2019 1609   GFRNONAA >60 09/30/2018 2120   GFRAA >60 09/30/2018 2120   CrCl cannot be calculated (Patient's most recent lab result is older than the maximum 21 days allowed.).  COAG Lab Results  Component Value Date   INR 1.14 02/26/2018    Radiology No results found.   Assessment/Plan 1. Chronic deep vein thrombosis (DVT) of femoral vein of left lower extremity (HCC) Recommend:   No surgery or intervention at this point in time.  IVC filter is not indicated at present.  Patient's duplex ultrasound of the venous system shows DVT from the popliteal to the femoral veins.  The patient is initiated on anticoagulation and has completed approximately 1 year.  We will draw a hypercoagulable panel review this with him and then discuss  cessation of his anticoagulation therapy as his DVT/PE was provoked after a long car ride and this was his first episode  Elevation was stressed, use of a recliner was discussed.  I have had a long discussion with the patient regarding DVT and post phlebitic changes such as swelling and why it  causes symptoms such as pain.  The patient will wear graduated compression stockings class 1 (20-30 mmHg), beginning after three full days of anticoagulation, on a daily basis a prescription was given. The patient will  beginning wearing the stockings first thing in the morning and removing them in the evening. The patient is instructed specifically not to sleep in the stockings.  In addition, behavioral modification including elevation during the day and avoidance of prolonged dependency will be initiated.    - Hypercoagulable panel, comprehensive  2. Chronic pulmonary embolism, unspecified pulmonary embolism type, unspecified whether acute cor pulmonale present (HCC) Recommend:   No surgery or intervention  at this point in time.  IVC filter is not indicated at present.  Patient's duplex ultrasound of the venous system shows DVT from the popliteal to the femoral veins.  The patient is initiated on anticoagulation and has completed approximately 1 year.  We will draw a hypercoagulable panel review this with him and then discuss cessation of his anticoagulation therapy as his DVT/PE was provoked after a long car ride and this was his first episode  Elevation was stressed, use of a recliner was discussed.  I have had a long discussion with the patient regarding DVT and post phlebitic changes such as swelling and why it  causes symptoms such as pain.  The patient will wear graduated compression stockings class 1 (20-30 mmHg), beginning after three full days of anticoagulation, on a daily basis a prescription was given. The patient will  beginning wearing the stockings first thing in the morning and removing  them in the evening. The patient is instructed specifically not to sleep in the stockings.  In addition, behavioral modification including elevation during the day and avoidance of prolonged dependency will be initiated.    - Hypercoagulable panel, comprehensive  3. Essential hypertension Continue antihypertensive medications as already ordered, these medications have been reviewed and there are no changes at this time.   4. Type 2 diabetes mellitus with hyperglycemia, without long-term current use of insulin (HCC) Continue hypoglycemic medications as already ordered, these medications have been reviewed and there are no changes at this time.  Hgb A1C to be monitored as already arranged by primary service   5. Primary osteoarthritis of left knee Continue NSAID medications as already ordered, these medications have been reviewed and there are no changes at this time.  Continued activity and therapy was stressed.     Levora Dredge, MD  03/12/2020 10:24 AM

## 2020-03-13 ENCOUNTER — Encounter (INDEPENDENT_AMBULATORY_CARE_PROVIDER_SITE_OTHER): Payer: Self-pay | Admitting: Vascular Surgery

## 2020-03-13 ENCOUNTER — Other Ambulatory Visit: Payer: Self-pay

## 2020-03-13 ENCOUNTER — Ambulatory Visit (INDEPENDENT_AMBULATORY_CARE_PROVIDER_SITE_OTHER): Payer: Managed Care, Other (non HMO) | Admitting: Vascular Surgery

## 2020-03-13 VITALS — BP 130/87 | HR 61 | Resp 16 | Wt >= 6400 oz

## 2020-03-13 DIAGNOSIS — I82512 Chronic embolism and thrombosis of left femoral vein: Secondary | ICD-10-CM

## 2020-03-13 DIAGNOSIS — E1165 Type 2 diabetes mellitus with hyperglycemia: Secondary | ICD-10-CM

## 2020-03-13 DIAGNOSIS — M1712 Unilateral primary osteoarthritis, left knee: Secondary | ICD-10-CM

## 2020-03-13 DIAGNOSIS — I10 Essential (primary) hypertension: Secondary | ICD-10-CM

## 2020-03-13 DIAGNOSIS — I2782 Chronic pulmonary embolism: Secondary | ICD-10-CM | POA: Diagnosis not present

## 2020-03-14 ENCOUNTER — Encounter (INDEPENDENT_AMBULATORY_CARE_PROVIDER_SITE_OTHER): Payer: Self-pay | Admitting: Vascular Surgery

## 2020-04-07 ENCOUNTER — Ambulatory Visit (INDEPENDENT_AMBULATORY_CARE_PROVIDER_SITE_OTHER): Payer: Managed Care, Other (non HMO) | Admitting: Vascular Surgery

## 2020-04-08 LAB — HYPERCOAGULABLE PANEL, COMPREHENSIVE
APTT: 21.7 s — ABNORMAL LOW
AT III Act/Nor PPP Chro: 106 %
Act. Prt C Resist w/FV Defic.: 3.1 ratio
Anticardiolipin Ab, IgG: <10 [GPL'U]
Anticardiolipin Ab, IgM: <10 [MPL'U]
Beta-2 Glycoprotein I, IgA: <10 SAU
Beta-2 Glycoprotein I, IgG: <10 SGU
Beta-2 Glycoprotein I, IgM: <10 SMU
DRVVT Screen Seconds: 34 s
Factor VII Antigen**: 130 %
Factor VIII Activity: 276 % — ABNORMAL HIGH
Hexagonal Phospholipid Neutral: 0 s
Homocysteine: 9 umol/L
Prot C Ag Act/Nor PPP Imm: 112 %
Prot S Ag Act/Nor PPP Imm: 101 %
Protein C Ag/FVII Ag Ratio**: 0.9 ratio
Protein S Ag/FVII Ag Ratio**: 0.8 ratio

## 2020-04-09 ENCOUNTER — Encounter (INDEPENDENT_AMBULATORY_CARE_PROVIDER_SITE_OTHER): Payer: Self-pay

## 2020-04-09 ENCOUNTER — Telehealth (INDEPENDENT_AMBULATORY_CARE_PROVIDER_SITE_OTHER): Payer: Self-pay | Admitting: Vascular Surgery

## 2020-04-09 NOTE — Telephone Encounter (Signed)
Called to schedule patient for GS to go over blood work results. Added patient to the wailist to be seen sooner if we have any cancellations. This note is for documentation purposes only.

## 2020-04-21 ENCOUNTER — Ambulatory Visit (INDEPENDENT_AMBULATORY_CARE_PROVIDER_SITE_OTHER): Payer: Managed Care, Other (non HMO) | Admitting: Vascular Surgery

## 2020-04-28 ENCOUNTER — Ambulatory Visit (INDEPENDENT_AMBULATORY_CARE_PROVIDER_SITE_OTHER): Payer: Managed Care, Other (non HMO) | Admitting: Vascular Surgery

## 2020-05-19 ENCOUNTER — Ambulatory Visit (INDEPENDENT_AMBULATORY_CARE_PROVIDER_SITE_OTHER): Payer: Managed Care, Other (non HMO) | Admitting: Vascular Surgery

## 2020-05-29 ENCOUNTER — Encounter (INDEPENDENT_AMBULATORY_CARE_PROVIDER_SITE_OTHER): Payer: Self-pay | Admitting: Vascular Surgery

## 2020-05-29 ENCOUNTER — Ambulatory Visit (INDEPENDENT_AMBULATORY_CARE_PROVIDER_SITE_OTHER): Payer: Managed Care, Other (non HMO) | Admitting: Vascular Surgery

## 2020-05-29 ENCOUNTER — Other Ambulatory Visit: Payer: Self-pay | Admitting: Family Medicine

## 2020-05-29 ENCOUNTER — Other Ambulatory Visit: Payer: Self-pay

## 2020-05-29 VITALS — BP 139/89 | HR 64 | Resp 16 | Wt >= 6400 oz

## 2020-05-29 DIAGNOSIS — E1165 Type 2 diabetes mellitus with hyperglycemia: Secondary | ICD-10-CM

## 2020-05-29 DIAGNOSIS — I82512 Chronic embolism and thrombosis of left femoral vein: Secondary | ICD-10-CM | POA: Diagnosis not present

## 2020-05-29 DIAGNOSIS — D6859 Other primary thrombophilia: Secondary | ICD-10-CM

## 2020-05-29 DIAGNOSIS — M170 Bilateral primary osteoarthritis of knee: Secondary | ICD-10-CM

## 2020-05-29 DIAGNOSIS — I10 Essential (primary) hypertension: Secondary | ICD-10-CM

## 2020-05-29 NOTE — Progress Notes (Signed)
MRN : [id number]  Hunter Hobbs is a 60 y.o. (03-Feb-1961) male who presents with chief complaint of No chief complaint on file. Marland Kitchen  History of Present Illness:   The patient presents to the office for follow upevaluation of DVT. DVT was identified at [facility] by Duplex ultrasound on 10/16.   At that time he underwent: Percutaneous transluminal angioplasty of the superficial femoral vein to 10 mmwith infusion thrombolysis with 16 mg of TPAand mechanical thrombectomy of the leftpopliteal, SFV and common femoral vein using the penumbra cat 6catheter.   The initial symptoms were pain and swelling in the lower extremity.As the inciting event hedrove 16 hours with minimal stops last week and has been having pain in the leg since that time. Over the past few days it has gotten worse, and he sought medical treatment. No previous history of DVT, superficial thrombophlebitis, or other clotting issues. No family history that he knows of.  The patient notes the leg continues to be very painful with dependency and swellswhen he wears compression socks knee-high but when he wears more of a full compression pants he has far fewer symptoms. Symptoms are much better with elevation. The patient notes minimal edema in the morning which steadily worsens throughout the day.   The patient has been using compression therapy at this point.  He has stopped taking his Xarelto as he does not feel comfortable on anticoagulation.  No SOB or pleuritic chest pains. No cough or hemoptysis.  No blood per rectum or blood in any sputum. No excessive bruising per the patient.  Hypercoagulable panel is reviewed and he is + for elevated Factor VIII  No outpatient medications have been marked as taking for the 05/29/20 encounter (Appointment) with Gilda Crease, Latina Craver, MD.    Past Medical History:  Diagnosis Date  . Arthritis   . H/O blood clots   . Hypertension     Past Surgical History:   Procedure Laterality Date  . KNEE SURGERY    . PERIPHERAL VASCULAR THROMBECTOMY Left 03/01/2018   Procedure: PERIPHERAL VASCULAR THROMBECTOMY;  Surgeon: Renford Dills, MD;  Location: [facility] INVASIVE CV LAB;  Service: Cardiovascular;  Laterality: Left;    Social History Social History   Tobacco Use  . Smoking status: Never Smoker  . Smokeless tobacco: Never Used  Substance Use Topics  . Alcohol use: Not Currently  . Drug use: Never    Family History Family History  Problem Relation Age of Onset  . Hypertension Mother   . Hypertension Sister     Allergies  Allergen Reactions  . Iodinated Diagnostic Agents Anaphylaxis  . Amlodipine     dizziness     REVIEW OF SYSTEMS (Negative unless checked)  Constitutional: [] Weight loss  [] Fever  [] Chills Cardiac: [] Chest pain   [] Chest pressure   [] Palpitations   [] Shortness of breath when laying flat   [] Shortness of breath with exertion. Vascular:  [] Pain in legs with walking   [] Pain in legs at rest  [x] History of DVT   [] Phlebitis   [] Swelling in legs   [] Varicose veins   [] Non-healing ulcers Pulmonary:   [] Uses home oxygen   [] Productive cough   [] Hemoptysis   [] Wheeze  [] COPD   [] Asthma Neurologic:  [] Dizziness   [] Seizures   [] History of stroke   [] History of TIA  [] Aphasia   [] Vissual changes   [] Weakness or numbness in arm   [] Weakness or numbness in leg Musculoskeletal:   [] Joint swelling   [] Joint pain   []   Low back pain Hematologic:  [] Easy bruising  [] Easy bleeding   [x] Hypercoagulable state   [] Anemic Gastrointestinal:  [] Diarrhea   [] Vomiting  [] Gastroesophageal reflux/heartburn   [] Difficulty swallowing. Genitourinary:  [] Chronic kidney disease   [] Difficult urination  [] Frequent urination   [] Blood in urine Skin:  [] Rashes   [] Ulcers  Psychological:  [] History of anxiety   []  History of major depression.  Physical Examination  There were no vitals filed for this visit. There is no height or weight on file to  calculate BMI. Gen: WD/WN, NAD Head: [place]/AT, No temporalis wasting.  Ear/Nose/Throat: Hearing grossly intact, nares w/o erythema or drainage Eyes: PER, EOMI, sclera nonicteric.  Neck: Supple, no large masses.   Pulmonary:  Good air movement, no audible wheezing bilaterally, no use of accessory muscles.  Cardiac: RRR, no JVD Vascular: scattered varicosities present bilaterally.  Mild to moderate venous stasis changes to the legs bilaterally.  1-2+ soft pitting edema. Vessel Right Left  Radial Palpable Palpable  Gastrointestinal: Non-distended. No guarding/no peritoneal signs.  Musculoskeletal: M/S 5/5 throughout.  No deformity or atrophy.  Neurologic: CN 2-12 intact. Symmetrical.  Speech is fluent. Motor exam as listed above. Psychiatric: Judgment intact, Mood & affect appropriate for pt's clinical situation. Dermatologic: Venous rashes no ulcers noted.  No changes consistent with cellulitis.   CBC Lab Results  Component Value Date   WBC 8.4 08/14/2019   HGB 15.0 08/14/2019   HCT 43.6 08/14/2019   MCV 86.4 08/14/2019   PLT 208.0 08/14/2019    BMET    Component Value Date/Time   NA 137 08/14/2019 1609   K 3.8 08/14/2019 1609   CL 104 08/14/2019 1609   CO2 24 08/14/2019 1609   GLUCOSE 119 (H) 08/14/2019 1609   BUN 14 08/14/2019 1609   CREATININE 1.07 08/14/2019 1609   CALCIUM 9.3 08/14/2019 1609   GFRNONAA >60 09/30/2018 2120   GFRAA >60 09/30/2018 2120   CrCl cannot be calculated (Patient's most recent lab result is older than the maximum 21 days allowed.).  COAG Lab Results  Component Value Date   INR 1.14 02/26/2018    Radiology No results found.   Assessment/Plan 1. Chronic deep vein thrombosis (DVT) of femoral vein of left lower extremity (HCC) Recommend:   No surgery or intervention at this point in time.  IVC filter is not indicated at present.  Patient's duplex ultrasound of the venous system shows chronic DVT from the popliteal to the femoral  veins.  The patient is no longer on anticoagulation   Elevation was stressed, use of a recliner was discussed.  I have had a long discussion with the patient regarding DVT and post phlebitic changes such as swelling and why it  causes symptoms such as pain.  The patient will wear graduated compression stockings class 1 (20-30 mmHg), beginning after three full days of anticoagulation, on a daily basis a prescription was given. The patient will  beginning wearing the stockings first thing in the morning and removing them in the evening. The patient is instructed specifically not to sleep in the stockings.  In addition, behavioral modification including elevation during the day and avoidance of prolonged dependency will be initiated.     A total of 35 minutes was spent with this patient and greater than 50% was spent in counseling and coordination of care with the patient.  Discussion included the treatment options for vascular disease including indications for surgery and intervention.  Also discussed is the appropriate timing of treatment.  In  addition medical therapy was discussed.  2. Primary hypercoagulable state (HCC) I discussed this finding with him and his increased risk of DVT.  I also discussed referral to Hematology but the patient did not wish to do this at this time.  3. Essential hypertension Continue antihypertensive medications as already ordered, these medications have been reviewed and there are no changes at this time.   4. Type 2 diabetes mellitus with hyperglycemia, without long-term current use of insulin (HCC) Continue hypoglycemic medications as already ordered, these medications have been reviewed and there are no changes at this time.  Hgb A1C to be monitored as already arranged by primary service   5. Osteoarthritis of both knees, unspecified osteoarthritis type Continue NSAID medications as already ordered, these medications have been reviewed and there are no  changes at this time.  Continued activity and therapy was stressed.     Levora Dredge, MD  05/29/2020 1:16 PM

## 2020-06-01 ENCOUNTER — Encounter (INDEPENDENT_AMBULATORY_CARE_PROVIDER_SITE_OTHER): Payer: Self-pay | Admitting: Vascular Surgery

## 2020-06-01 DIAGNOSIS — D6859 Other primary thrombophilia: Secondary | ICD-10-CM | POA: Insufficient documentation

## 2020-07-08 ENCOUNTER — Other Ambulatory Visit: Payer: Self-pay | Admitting: Family Medicine

## 2020-07-08 DIAGNOSIS — I10 Essential (primary) hypertension: Secondary | ICD-10-CM

## 2020-07-22 NOTE — Progress Notes (Signed)
Patient was last seen on 05/29/20 with Dr. Gilda Crease. Please advise.

## 2020-07-25 ENCOUNTER — Other Ambulatory Visit: Payer: Self-pay

## 2020-07-25 ENCOUNTER — Ambulatory Visit (INDEPENDENT_AMBULATORY_CARE_PROVIDER_SITE_OTHER): Payer: Managed Care, Other (non HMO)

## 2020-07-25 ENCOUNTER — Other Ambulatory Visit (INDEPENDENT_AMBULATORY_CARE_PROVIDER_SITE_OTHER): Payer: Self-pay | Admitting: Nurse Practitioner

## 2020-07-25 DIAGNOSIS — I739 Peripheral vascular disease, unspecified: Secondary | ICD-10-CM

## 2020-08-08 ENCOUNTER — Other Ambulatory Visit: Payer: Self-pay

## 2020-08-08 ENCOUNTER — Encounter: Payer: Self-pay | Admitting: Family Medicine

## 2020-08-08 ENCOUNTER — Ambulatory Visit: Payer: Managed Care, Other (non HMO) | Admitting: Family Medicine

## 2020-08-08 VITALS — BP 110/76 | HR 81 | Temp 98.5°F | Ht 79.0 in | Wt 392.5 lb

## 2020-08-08 DIAGNOSIS — I2782 Chronic pulmonary embolism: Secondary | ICD-10-CM | POA: Diagnosis not present

## 2020-08-08 DIAGNOSIS — E1165 Type 2 diabetes mellitus with hyperglycemia: Secondary | ICD-10-CM

## 2020-08-08 DIAGNOSIS — I5032 Chronic diastolic (congestive) heart failure: Secondary | ICD-10-CM

## 2020-08-08 LAB — CBC
HCT: 42.7 % (ref 39.0–52.0)
Hemoglobin: 14.4 g/dL (ref 13.0–17.0)
MCHC: 33.6 g/dL (ref 30.0–36.0)
MCV: 83.6 fl (ref 78.0–100.0)
Platelets: 178 10*3/uL (ref 150.0–400.0)
RBC: 5.11 Mil/uL (ref 4.22–5.81)
RDW: 13.6 % (ref 11.5–15.5)
WBC: 8.3 10*3/uL (ref 4.0–10.5)

## 2020-08-08 LAB — HEMOGLOBIN A1C: Hgb A1c MFr Bld: 13.2 % — ABNORMAL HIGH (ref 4.6–6.5)

## 2020-08-08 LAB — COMPREHENSIVE METABOLIC PANEL
ALT: 20 U/L (ref 0–53)
AST: 15 U/L (ref 0–37)
Albumin: 4.3 g/dL (ref 3.5–5.2)
Alkaline Phosphatase: 80 U/L (ref 39–117)
BUN: 16 mg/dL (ref 6–23)
CO2: 24 mEq/L (ref 19–32)
Calcium: 9.4 mg/dL (ref 8.4–10.5)
Chloride: 102 mEq/L (ref 96–112)
Creatinine, Ser: 0.97 mg/dL (ref 0.40–1.50)
GFR: 85.96 mL/min (ref >60.00)
Glucose, Bld: 380 mg/dL — ABNORMAL HIGH (ref 70–99)
Potassium: 4 mEq/L (ref 3.5–5.1)
Sodium: 136 mEq/L (ref 135–145)
Total Bilirubin: 1.2 mg/dL (ref 0.2–1.2)
Total Protein: 6.9 g/dL (ref 6.0–8.3)

## 2020-08-08 LAB — LIPID PANEL
Cholesterol: 64 mg/dL (ref 0–200)
HDL: 39.6 mg/dL (ref >39.00)
LDL Cholesterol: <7 mg/dL (ref 0–99)
NonHDL: 24.04
Total CHOL/HDL Ratio: 2
Triglycerides: 148 mg/dL (ref 0.0–149.0)
VLDL: 29.6 mg/dL (ref 0.0–40.0)

## 2020-08-08 LAB — MICROALBUMIN / CREATININE URINE RATIO
Creatinine,U: 146.9 mg/dL
Microalb Creat Ratio: 1.3 mg/g (ref 0.0–30.0)
Microalb, Ur: 1.8 mg/dL (ref 0.0–1.9)

## 2020-08-08 MED ORDER — METFORMIN HCL 500 MG PO TABS: ORAL_TABLET | ORAL | 1 refills | Status: DC

## 2020-08-08 NOTE — Patient Instructions (Addendum)
Give us 2-3 business days to get the results of your labs back.   Keep the diet clean and stay active.  If you do not hear anything about your referral in the next 1-2 weeks, call our office and ask for an update.  Let us know if you need anything. 

## 2020-08-08 NOTE — Progress Notes (Signed)
Chief Complaint  Patient presents with  . Blood Sugar Problem    Blood sugar was 398 this morning    Subjective: Patient is a 60 y.o. male here for hyperglycemia.  Sugars have been in the 300-400's over the past week and a half. He has a hx of DM, not on meds, was trying to control w diet. Last A1c was 6.8. No changes in diet/exercise. He started getting thirsty and peeing more freq. No Cp or SOB. He was not checking his sugars prior to this happening.   Past Medical History:  Diagnosis Date  . Arthritis   . H/O blood clots   . Hypertension     Objective: BP 110/76 (BP Location: Left Arm, Patient Position: Sitting, Cuff Size: Large)   Pulse 81   Temp 98.5 F (36.9 C) (Oral)   Ht 6\' 7"  (2.007 m)   Wt (!) 392 lb 8 oz (178 kg)   SpO2 97%   BMI 44.22 kg/m  General: Awake, appears stated age Heart: RRR, no LE edema Lungs: CTAB, no rales, wheezes or rhonchi. No accessory muscle use Neuro: Decreased sensation to pinprick b/l Skin: No ext lesions noted on feet Psych: Age appropriate judgment and insight, normal affect and mood  Assessment and Plan: Type 2 diabetes mellitus with hyperglycemia, without long-term current use of insulin (HCC) - Plan: Comprehensive metabolic panel, Lipid panel, Hemoglobin A1c, CBC, Microalbumin / creatinine urine ratio, Anti-islet cell antibody, Ambulatory referral to Ophthalmology, metFORMIN (GLUCOPHAGE) 500 MG tablet  Chronic diastolic congestive heart failure (HCC)  Chronic pulmonary embolism, unspecified pulmonary embolism type, unspecified whether acute cor pulmonale present (HCC), Chronic  Morbid obesity (HCC), Chronic  Chronic, uncontrolled. Start metformin. Counseled on diet/exercise. Will monitor sugars at home. F/u in 2 weeks. If still elevated, will see if we can add given hx of HF.  The patient voiced understanding and agreement to the plan.  [place], DO 08/08/20  9:16 AM

## 2020-08-12 LAB — ANTI-ISLET CELL ANTIBODY: Islet Cell Ab: NEGATIVE

## 2020-08-22 ENCOUNTER — Ambulatory Visit: Payer: Managed Care, Other (non HMO) | Admitting: Family Medicine

## 2020-08-22 ENCOUNTER — Other Ambulatory Visit: Payer: Self-pay

## 2020-08-22 ENCOUNTER — Encounter: Payer: Self-pay | Admitting: Family Medicine

## 2020-08-22 VITALS — BP 134/84 | HR 76 | Temp 98.3°F | Ht 79.0 in | Wt 391.2 lb

## 2020-08-22 DIAGNOSIS — E1165 Type 2 diabetes mellitus with hyperglycemia: Secondary | ICD-10-CM

## 2020-08-22 NOTE — Progress Notes (Signed)
Chief Complaint  Patient presents with  . Follow-up    Blood sugar     Subjective: Patient is a 60 y.o. male here for DM f/u.  His sugars are running in the 300's. He has significantly reduced his portions.  Patient is not exercising.  He is compliant with the metformin, currently on 500 mg bid. He is ramping up to 1000 mg twice daily.  No adverse effects.  He continues to lose weight.   Past Medical History:  Diagnosis Date  . Arthritis   . H/O blood clots   . Hypertension     Objective: BP 134/84 (BP Location: Left Arm, Patient Position: Sitting, Cuff Size: Large)   Pulse 76   Temp 98.3 F (36.8 C) (Oral)   Ht 6\' 7"  (2.007 m)   Wt (!) 391 lb 4 oz (177.5 kg)   SpO2 98%   BMI 44.08 kg/m  General: Awake, appears stated age Heart: RRR, no lower extremity edema Lungs: CTAB, no rales, wheezes or rhonchi. No accessory muscle use Psych: Age appropriate judgment and insight, normal affect and mood  Assessment and Plan: Type 2 diabetes mellitus with hyperglycemia, unspecified whether long term insulin use (HCC)  Status: Chronic, uncontrolled.  He still having hyperglycemia but is moving in the right direction.  Continue to ramp up Metformin to 1000 mg twice daily.  Send a message in 3 weeks.  Depending on his readings, we will add an SGLT2 blocker.  We also discussed GLP-1's but he does not want to give himself a shot.  He is doing well with cutting down on portion sizes.  As he continues to lose weight, exercising may be more feasible.  I will see him in 6 weeks. The patient voiced understanding and agreement to the plan.  [place], DO 08/22/20  4:40 PM

## 2020-08-22 NOTE — Patient Instructions (Signed)
Strong work.   Continue checking your sugars.  Send me a message in 3 weeks letting me know how your sugars are doing and how you are doing on the Metformin. We will likely add a new medicine.   Let us know if you need anything.

## 2020-08-23 ENCOUNTER — Other Ambulatory Visit: Payer: Self-pay | Admitting: Family Medicine

## 2020-08-23 DIAGNOSIS — E1165 Type 2 diabetes mellitus with hyperglycemia: Secondary | ICD-10-CM

## 2020-08-25 ENCOUNTER — Other Ambulatory Visit: Payer: Self-pay | Admitting: Family Medicine

## 2020-08-25 DIAGNOSIS — E1165 Type 2 diabetes mellitus with hyperglycemia: Secondary | ICD-10-CM

## 2020-08-25 MED ORDER — METFORMIN HCL 500 MG PO TABS: ORAL_TABLET | ORAL | 0 refills | Status: DC

## 2020-09-03 ENCOUNTER — Other Ambulatory Visit: Payer: Self-pay | Admitting: Family Medicine

## 2020-09-03 DIAGNOSIS — E1165 Type 2 diabetes mellitus with hyperglycemia: Secondary | ICD-10-CM

## 2020-09-08 ENCOUNTER — Other Ambulatory Visit: Payer: Self-pay | Admitting: Family Medicine

## 2020-09-08 MED ORDER — METFORMIN HCL ER 500 MG PO TB24: 1000.0000 mg | ORAL_TABLET | Freq: Two times a day (BID) | ORAL | 2 refills | Status: DC

## 2020-10-03 ENCOUNTER — Ambulatory Visit: Payer: Managed Care, Other (non HMO) | Admitting: Family Medicine

## 2020-10-03 ENCOUNTER — Encounter: Payer: Self-pay | Admitting: Family Medicine

## 2020-10-03 VITALS — BP 138/86 | HR 87 | Temp 97.7°F | Resp 18 | Ht 79.0 in | Wt 393.0 lb

## 2020-10-03 DIAGNOSIS — E1165 Type 2 diabetes mellitus with hyperglycemia: Secondary | ICD-10-CM

## 2020-10-03 NOTE — Progress Notes (Signed)
Chief Complaint  Patient presents with  . Diabetes    6 week follow up, recheck dvt     Subjective: Patient is a 60 y.o. male here for f/u DM.  Sugars running in high 100's on average. He is compliant w Metformin XR 1000 mg bid. No AE's. Diet is fair, has cut down on portions. Stairs for exercise. No CP or SOB.   Past Medical History:  Diagnosis Date  . Arthritis   . H/O blood clots   . Hypertension     Objective: BP 138/86 (BP Location: Right Arm, Patient Position: Sitting, Cuff Size: Normal)   Pulse 87   Temp 97.7 F (36.5 C)   Resp 18   Ht 6\' 7"  (2.007 m)   Wt (!) 393 lb (178.3 kg)   SpO2 96%   BMI 44.27 kg/m  General: Awake, appears stated age Heart: RRR, no bruits Lungs: CTAB, no rales, wheezes or rhonchi. No accessory muscle use Psych: Age appropriate judgment and insight, normal affect and mood  Assessment and Plan: Type 2 diabetes mellitus with hyperglycemia, without long-term current use of insulin (HCC)  Chronic. Uncontrolled. Diet could be better, but he has cut back on portions and lost weight. Sugars much better than 2 mo ago. Cont Metformin XR 1000 mg bid. Offered SGLT-2, opted for continued lifestyle mods. F/u in 6 weeks. Will ck A1c then.  The patient voiced understanding and agreement to the plan.  [place], DO 10/03/20  4:26 PM

## 2020-10-03 NOTE — Patient Instructions (Signed)
Keep the diet clean and stay active.  Continue checking your sugars at home.  Aim to do some physical exertion for 150 minutes per week. This is typically divided into 5 days per week, 30 minutes per day. The activity should be enough to get your heart rate up. Anything is better than nothing if you have time constraints.  Let us know if you need anything.

## 2020-12-04 ENCOUNTER — Other Ambulatory Visit: Payer: Self-pay | Admitting: Family Medicine

## 2020-12-05 ENCOUNTER — Other Ambulatory Visit: Payer: Self-pay | Admitting: Family Medicine

## 2020-12-05 MED ORDER — METFORMIN HCL ER 500 MG PO TB24: 1000.0000 mg | ORAL_TABLET | Freq: Two times a day (BID) | ORAL | 3 refills | Status: AC

## 2022-03-15 ENCOUNTER — Encounter (INDEPENDENT_AMBULATORY_CARE_PROVIDER_SITE_OTHER): Payer: Self-pay
